# Patient Record
Sex: Female | Born: 1995 | Race: Black or African American | Hispanic: No | Marital: Married | Smoking: Never smoker
Health system: Southern US, Community
[De-identification: ages and names within clinical notes are randomized; demographics above are authoritative.]

## PROBLEM LIST (undated history)

## (undated) ENCOUNTER — Ambulatory Visit: Admission: EM | Payer: 59 | Source: Home / Self Care

## (undated) DIAGNOSIS — Z789 Other specified health status: Secondary | ICD-10-CM

## (undated) DIAGNOSIS — D571 Sickle-cell disease without crisis: Secondary | ICD-10-CM

## (undated) DIAGNOSIS — D649 Anemia, unspecified: Secondary | ICD-10-CM

## (undated) DIAGNOSIS — K219 Gastro-esophageal reflux disease without esophagitis: Secondary | ICD-10-CM

## (undated) HISTORY — DX: Anemia, unspecified: D64.9

## (undated) HISTORY — PX: HERNIA REPAIR: SHX51

---
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
## 2013-11-05 ENCOUNTER — Ambulatory Visit (INDEPENDENT_AMBULATORY_CARE_PROVIDER_SITE_OTHER): Payer: 59 | Admitting: Internal Medicine

## 2013-11-05 VITALS — BP 110/80 | HR 70 | Temp 98.8°F | Ht 62.0 in | Wt 177.6 lb

## 2013-11-05 DIAGNOSIS — R42 Dizziness and giddiness: Secondary | ICD-10-CM

## 2013-11-05 DIAGNOSIS — I890 Lymphedema, not elsewhere classified: Secondary | ICD-10-CM

## 2013-11-05 DIAGNOSIS — N946 Dysmenorrhea, unspecified: Secondary | ICD-10-CM

## 2013-11-05 NOTE — Progress Notes (Addendum)
Subjective:    Patient ID: Ellen Roberts Spires, female    DOB: 19-Apr-1996, [age] y.o.   MRN: [id number]  Dizziness Associated symptoms include fatigue. Pertinent negatives include no abdominal pain, chest pain, fever, headaches or weakness.   HPI Comments: Ellen Roberts Fjelstad is a 18 y.o. female who presents to the [facility] complaining of dizziness onset 48 hours. She states she has associated abdominal pain, neck pain, leg edema.    Menses started 2 days ago--dysmenorrhea--States she took Midol with no relief...and she went to work yesterday /had to leave work early due to dizziness. She denies headache, trouble swallowing, palpations, Appetite change, ear pain, dysuria, Vision changes or nausea.   She states she has a history of anemia.  - period is normally 4 days in length-- menstrual cycle is about a week early this month. She states she hasn't been sexually active for 1 year.    She states that she has a history of hernia repair a few years prior and has noticed the L leg swelling after. She states this leg is swollen today more than normal,and she notices this happens when working for long hours on her feet. Currently long shifts at [employer]. 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 She has had eval incl ?scan and been given compr stock which didn't work well.    For last few months, she has noticed numbness in her breasts when she wakes up in the morning. She states this is improves with movement. She denies finding any mass, discharge, rash, radiating factors in the breasts-Started when she discontinued bra at night.      Review of Systems  Constitutional: Positive for fatigue. Negative for fever, appetite change and unexpected weight change.  HENT: Negative for ear pain and trouble swallowing.   Eyes: Negative for visual disturbance.  Respiratory: Negative for chest tightness and shortness of breath.   Cardiovascular: Negative for chest pain, palpitations and leg  swelling.  Gastrointestinal: Positive for constipation. Negative for abdominal pain and blood in stool.  Genitourinary: Negative for dysuria, urgency, frequency and difficulty urinating.  Neurological: Positive for dizziness. Negative for weakness and headaches.  Psychiatric/Behavioral: Negative for confusion and sleep disturbance.    Objective:   BP 110/80  Pulse 70  Temp(Src) 98.8 F (37.1 C) (Oral)  Ht 5\' 2"  (1.575 m)  Wt 177 lb 9.6 oz (80.559 kg)  BMI 32.48 kg/m2  SpO2 100%   Physical Exam  Nursing note and vitals reviewed. Constitutional: She is oriented to person, place, and time. She appears well-developed and well-nourished. No distress.  HENT:  Head: Normocephalic and atraumatic.  Right Ear: External ear normal.  Left Ear: External ear normal.  Nose: Nose normal.  Mouth/Throat: Oropharynx is clear and moist.  Eyes: Conjunctivae and EOM are normal. Pupils are equal, round, and reactive to light.  CONJ pale.   Neck: Neck supple. No thyromegaly present.  Cardiovascular: Normal rate, regular rhythm, normal heart sounds and intact distal pulses.   No murmur heard. Pulmonary/Chest: Effort normal and breath sounds normal. No respiratory distress. Right breast exhibits no inverted nipple, no mass, no nipple discharge, no skin change and no tenderness. Left breast exhibits no inverted nipple, no mass, no nipple discharge, no skin change and no tenderness.  Abdominal: Soft. Bowel sounds are normal. She exhibits no distension and no mass. There is no tenderness. There is no rebound and no guarding.  Musculoskeletal: Normal range of motion. She exhibits no tenderness.  Left leg  is swollen with non pitting edema from the knee to foot. .  Lymphadenopathy:    She has no cervical adenopathy.  Neurological: She is alert and oriented to person, place, and time. She has normal reflexes. She displays normal reflexes. No cranial nerve deficit. Coordination normal.  Skin: Skin is warm and  dry. No rash noted.  Psychiatric: She has a normal mood and affect. Her behavior is normal.      Assessment & Plan:  Dizziness -can't reprod w/ exam  ?anemia  Lymphedema -LLE---adv compr stok again plus less on the feet jobs  Dysmenorrhea -ibupr 800 tid 2-3d each menses//she declines OCPs  OW--will need attention   Call w/ labs --note re work given  I have completed the patient encounter in its entirety as documented by the scribe, with editing by me where necessary. TRUE Garciamartinez P. Merla Richesoolittle, M.D.

## 2013-11-06 LAB — COMPREHENSIVE METABOLIC PANEL
ALK PHOS: 48 U/L (ref 39–117)
ALT: 12 U/L (ref 0–35)
AST: 17 U/L (ref 0–37)
Albumin: 4.1 g/dL (ref 3.5–5.2)
BILIRUBIN TOTAL: 0.3 mg/dL (ref 0.2–1.2)
BUN: 9 mg/dL (ref 6–23)
CO2: 25 mEq/L (ref 19–32)
Calcium: 9.6 mg/dL (ref 8.4–10.5)
Chloride: 106 mEq/L (ref 96–112)
Creat: 1.06 mg/dL (ref 0.50–1.10)
GLUCOSE: 88 mg/dL (ref 70–99)
Potassium: 4.1 mEq/L (ref 3.5–5.3)
SODIUM: 140 meq/L (ref 135–145)
TOTAL PROTEIN: 7.3 g/dL (ref 6.0–8.3)

## 2013-11-06 LAB — CBC
HCT: 36.2 % (ref 36.0–46.0)
HEMOGLOBIN: 11.8 g/dL — AB (ref 12.0–15.0)
MCH: 24.6 pg — AB (ref 26.0–34.0)
MCHC: 32.6 g/dL (ref 30.0–36.0)
MCV: 75.6 fL — ABNORMAL LOW (ref 78.0–100.0)
PLATELETS: 247 10*3/uL (ref 150–400)
RBC: 4.79 MIL/uL (ref 3.87–5.11)
RDW: 14.7 % (ref 11.5–15.5)
WBC: 4.3 10*3/uL (ref 4.0–10.5)

## 2013-11-06 LAB — TSH: TSH: 2.299 u[IU]/mL (ref 0.350–4.500)

## 2013-11-06 NOTE — Addendum Note (Signed)
Addended by: Tonye PearsonOLITTLE, Saydie Gerdts P on: 11/06/2013 03:16 PM   Modules accepted: Level of Service

## 2013-11-12 ENCOUNTER — Emergency Department (HOSPITAL_COMMUNITY)
Admission: EM | Admit: 2013-11-12 | Discharge: 2013-11-12 | Disposition: A | Discharge status: Home / Self Care | Payer: 59 | Attending: Emergency Medicine | Admitting: Emergency Medicine

## 2013-11-12 ENCOUNTER — Encounter (HOSPITAL_COMMUNITY): Payer: Self-pay | Admitting: Emergency Medicine

## 2013-11-12 DIAGNOSIS — D649 Anemia, unspecified: Secondary | ICD-10-CM | POA: Insufficient documentation

## 2013-11-12 DIAGNOSIS — R5381 Other malaise: Secondary | ICD-10-CM | POA: Diagnosis not present

## 2013-11-12 DIAGNOSIS — Z3202 Encounter for pregnancy test, result negative: Secondary | ICD-10-CM | POA: Diagnosis not present

## 2013-11-12 DIAGNOSIS — Z79899 Other long term (current) drug therapy: Secondary | ICD-10-CM | POA: Diagnosis not present

## 2013-11-12 DIAGNOSIS — R5383 Other fatigue: Secondary | ICD-10-CM

## 2013-11-12 LAB — URINALYSIS, ROUTINE W REFLEX MICROSCOPIC
BILIRUBIN URINE: NEGATIVE
Glucose, UA: NEGATIVE mg/dL
HGB URINE DIPSTICK: NEGATIVE
Ketones, ur: NEGATIVE mg/dL
Leukocytes, UA: NEGATIVE
Nitrite: NEGATIVE
Protein, ur: NEGATIVE mg/dL
SPECIFIC GRAVITY, URINE: 1.008 (ref 1.005–1.030)
Urobilinogen, UA: 0.2 mg/dL (ref 0.0–1.0)
pH: 8 (ref 5.0–8.0)

## 2013-11-12 LAB — COMPREHENSIVE METABOLIC PANEL
ALK PHOS: 60 U/L (ref 39–117)
ALT: 19 U/L (ref 0–35)
ANION GAP: 12 (ref 5–15)
AST: 21 U/L (ref 0–37)
Albumin: 3.9 g/dL (ref 3.5–5.2)
BUN: 8 mg/dL (ref 6–23)
CO2: 26 mEq/L (ref 19–32)
CREATININE: 0.82 mg/dL (ref 0.50–1.10)
Calcium: 9.4 mg/dL (ref 8.4–10.5)
Chloride: 102 mEq/L (ref 96–112)
GFR calc non Af Amer: >90 mL/min (ref >90)
GLUCOSE: 91 mg/dL (ref 70–99)
POTASSIUM: 4.2 meq/L (ref 3.7–5.3)
Sodium: 140 mEq/L (ref 137–147)
TOTAL PROTEIN: 7.6 g/dL (ref 6.0–8.3)

## 2013-11-12 LAB — CBC WITH DIFFERENTIAL/PLATELET
BASOS ABS: 0 10*3/uL (ref 0.0–0.1)
Basophils Relative: 1 % (ref 0–1)
EOS ABS: 0 10*3/uL (ref 0.0–0.7)
Eosinophils Relative: 1 % (ref 0–5)
HCT: 37.6 % (ref 36.0–46.0)
Hemoglobin: 12.3 g/dL (ref 12.0–15.0)
LYMPHS PCT: 34 % (ref 12–46)
Lymphs Abs: 1.3 10*3/uL (ref 0.7–4.0)
MCH: 25.6 pg — ABNORMAL LOW (ref 26.0–34.0)
MCHC: 32.7 g/dL (ref 30.0–36.0)
MCV: 78.2 fL (ref 78.0–100.0)
MONO ABS: 0.2 10*3/uL (ref 0.1–1.0)
Monocytes Relative: 6 % (ref 3–12)
Neutro Abs: 2.4 10*3/uL (ref 1.7–7.7)
Neutrophils Relative %: 58 % (ref 43–77)
Platelets: 249 10*3/uL (ref 150–400)
RBC: 4.81 MIL/uL (ref 3.87–5.11)
RDW: 13.8 % (ref 11.5–15.5)
WBC: 4 10*3/uL (ref 4.0–10.5)

## 2013-11-12 LAB — MONONUCLEOSIS SCREEN: Mono Screen: NEGATIVE

## 2013-11-12 LAB — PREGNANCY, URINE: Preg Test, Ur: NEGATIVE

## 2013-11-12 MED ORDER — SODIUM CHLORIDE 0.9 % IV BOLUS (SEPSIS)
1000.0000 mL | Freq: Once | INTRAVENOUS | Status: AC
  Administered 2013-11-12: 1000 mL via INTRAVENOUS

## 2013-11-12 NOTE — ED Provider Notes (Signed)
Medical screening examination/treatment/procedure(s) were performed by non-physician practitioner and as supervising physician I was immediately available for consultation/collaboration.   EKG Interpretation None      Devoria AlbeIva Benedetto Ryder, MD, Armando GangFACEP   Ward GivensIva L Addilyne Backs, MD 11/12/13 [id number]

## 2013-11-12 NOTE — Discharge Instructions (Signed)
Be sure to get between 8 and 10 hours of sleep per night. Be sure to eat a balanced diet. Followup with a primary care doctor to discuss symptoms further as needed. Refer to the resource guide below if you do not have a primary care doctor.  Fatigue Fatigue is a feeling of tiredness, lack of energy, lack of motivation, or feeling tired all the time. Having enough rest, good nutrition, and reducing stress will normally reduce fatigue. Consult your caregiver if it persists. The nature of your fatigue will help your caregiver to find out its cause. The treatment is based on the cause.  CAUSES  There are many causes for fatigue. Most of the time, fatigue can be traced to one or more of your habits or routines. Most causes fit into one or more of three general areas. They are: Lifestyle problems  Sleep disturbances.  Overwork.  Physical exertion.  Unhealthy habits.  Poor eating habits or eating disorders.  Alcohol and/or drug use .  Lack of proper nutrition (malnutrition). Psychological problems  Stress and/or anxiety problems.  Depression.  Grief.  Boredom. Medical Problems or Conditions  Anemia.  Pregnancy.  Thyroid gland problems.  Recovery from major surgery.  Continuous pain.  Emphysema or asthma that is not well controlled  Allergic conditions.  Diabetes.  Infections (such as mononucleosis).  Obesity.  Sleep disorders, such as sleep apnea.  Heart failure or other heart-related problems.  Cancer.  Kidney disease.  Liver disease.  Effects of certain medicines such as antihistamines, cough and cold remedies, prescription pain medicines, heart and blood pressure medicines, drugs used for treatment of cancer, and some antidepressants. SYMPTOMS  The symptoms of fatigue include:   Lack of energy.  Lack of drive (motivation).  Drowsiness.  Feeling of indifference to the surroundings. DIAGNOSIS  The details of how you feel help guide your caregiver in  finding out what is causing the fatigue. You will be asked about your present and past health condition. It is important to review all medicines that you take, including prescription and non-prescription items. A thorough exam will be done. You will be questioned about your feelings, habits, and normal lifestyle. Your caregiver may suggest blood tests, urine tests, or other tests to look for common medical causes of fatigue.  TREATMENT  Fatigue is treated by correcting the underlying cause. For example, if you have continuous pain or depression, treating these causes will improve how you feel. Similarly, adjusting the dose of certain medicines will help in reducing fatigue.  HOME CARE INSTRUCTIONS   Try to get the required amount of good sleep every night.  Eat a healthy and nutritious diet, and drink enough water throughout the day.  Practice ways of relaxing (including yoga or meditation).  Exercise regularly.  Make plans to change situations that cause stress. Act on those plans so that stresses decrease over time. Keep your work and personal routine reasonable.  Avoid street drugs and minimize use of alcohol.  Start taking a daily multivitamin after consulting your caregiver. SEEK MEDICAL CARE IF:   You have persistent tiredness, which cannot be accounted for.  You have fever.  You have unintentional weight loss.  You have headaches.  You have disturbed sleep throughout the night.  You are feeling sad.  You have constipation.  You have dry skin.  You have gained weight.  You are taking any new or different medicines that you suspect are causing fatigue.  You are unable to sleep at night.  You  develop any unusual swelling of your legs or other parts of your body. SEEK IMMEDIATE MEDICAL CARE IF:   You are feeling confused.  Your vision is blurred.  You feel faint or pass out.  You develop severe headache.  You develop severe abdominal, pelvic, or back  pain.  You develop chest pain, shortness of breath, or an irregular or fast heartbeat.  You are unable to pass a normal amount of urine.  You develop abnormal bleeding such as bleeding from the rectum or you vomit blood.  You have thoughts about harming yourself or committing suicide.  You are worried that you might harm someone else. MAKE SURE YOU:   Understand these instructions.  Will watch your condition.  Will get help right away if you are not doing well or get worse. Document Released: 02/13/2007 Document Revised: 07/11/2011 Document Reviewed: 02/13/2007 [facility] Patient Information 2015 [place]. This information is not intended to replace advice given to you by your health care provider. Make sure you discuss any questions you have with your health care provider.  Emergency Department Resource Guide 1) Find a Doctor and Pay Out of Pocket Although you won't have to find out who is covered by your insurance plan, it is a good idea to ask around and get recommendations. You will then need to call the office and see if the doctor you have chosen will accept you as a new patient and what types of options they offer for patients who are self-pay. Some doctors offer discounts or will set up payment plans for their patients who do not have insurance, but you will need to ask so you aren't surprised when you get to your appointment.  2) Contact Your Local Health Department Not all health departments have doctors that can see patients for sick visits, but many do, so it is worth a call to see if yours does. If you don't know where your local health department is, you can check in your phone book. The CDC also has a tool to help you locate your state's health department, and many state websites also have listings of all of their local health departments.  3) Find a Walk-in Clinic If your illness is not likely to be very severe or complicated, you may want to try a walk in clinic.  These are popping up all over the country in pharmacies, drugstores, and shopping centers. They're usually staffed by nurse practitioners or physician assistants that have been trained to treat common illnesses and complaints. They're usually fairly quick and inexpensive. However, if you have serious medical issues or chronic medical problems, these are probably not your best option.  No Primary Care Doctor: - Call Health Connect at  [phone] - they can help you locate a primary care doctor that  accepts your insurance, provides certain services, etc. - Physician Referral Service- [phone]  Chronic Pain Problems: Organization         Address  Phone   Notes  [facility]  [phone] Patients need to be referred by their primary care doctor.   Medication Assistance: Organization         Address  Phone   Notes  [facility] [address] [place], [place] [phone]) [phone] --Must be a resident of [place] [facility] [place] -- Must have NO insurance coverage whatsoever (no Medicaid/ Medicare, etc.) -- The pt. MUST have a primary care doctor that directs their care regularly and follows them  in the community   Med[facility]ist  [phone]   [employer]  [phone]    Agencies that provide inexpensive medical care: Organization         Address  Phone   Notes  [facility]  [phone]   [facility]    [phone]   [facility] [address] [place], [place] [address] [phone]   [facility] [address] [place] [place] [phone]   Planned Parenthood    [phone]   [facility]    [phone]   [facility]  [address], [place] Phone:  [phone], Fax:  [phone] Hours of Operation:  9 am - 6 pm, M-F.  Also accepts Medicaid/Medicare and self-pay.  [facility] for  Children  [address], [place] Phone: [phone], Fax: [phone]. Hours of Operation:  8:30 am - 5:30 pm, M-F.  Also accepts Medicaid and self-pay.  [facility] [place] [address], [place] Phone: [phone]   [facility] [address] Natasha Bence [place], [place] [phone], Ext. [phone] Mondays & Thursdays: 7-9 AM.  First 15 patients are seen on a first come, first serve basis.    Medicaid-accepting [facility] Providers:  Organization         Address  Phone   Notes  [facility] [address], [place] [phone] Also accepts self-pay patients.  [facility] [address] Laurell Josephs [place], [place]  [phone]   [facility] [address], [place] [phone]   [facility] [address] [place] [phone]   Renaye Rakers [address], [place]   [phone] Only accepts [place] Access [place] patients after they have their name applied to their card.   Self-Pay (no insurance) in [facility]:  Organization         Address  Phone   Notes  Sickle Cell Patients, [facility] [address] [place], [place] [phone]   [facility] [address] [place], [place] [phone]   [facility] [place]  [address], [place] [phone]   [facility]/Dr. Osei-Bonsu  [address] [place] or [address], [place] [phone] Phone number for both [place] and [place] locations is the same.  [facility] [address] [place] [phone]   [facility] [address] [place] or [address] [phone]   [facility] [address] [place] [phone], phone; [phone], fax Sees patients 1st  and 3rd Saturday of every month.  Must not qualify for public or private insurance (i.e. Medicaid, Medicare, [place] Health Choice, Veterans' Benefits)  Household income should be no more than 200% of the poverty level The clinic cannot treat you if you are pregnant or think you are pregnant  Sexually transmitted diseases are not treated at the clinic.    Dental Care: Organization         Address  Phone  Notes  [facility] Department of [facility] [address] [place], [place] [phone] Accepts children up to age 67 who are enrolled in  Medicaid or [place] Health Choice; pregnant women with a Medicaid card; and children who have applied for Medicaid or [place] Health Choice, but were declined, whose parents can pay a reduced fee at time of service.  [facility] Department of [facility]  [address], [place] [phone] Accepts children up to age 91 who are enrolled in [place] or [place] Health Choice; pregnant women with a Medicaid card; and children who have applied for Medicaid or [place] Health Choice, but were declined, whose parents can pay a reduced fee at time of service.  [facility]  [address] [place], [place] [phone] Patients are seen by appointment only. Walk-ins are not accepted. [facility] will see patients 72 years of age and older. Monday - Tuesday (8am-5pm) Most Wednesdays (8:30-5pm) $30 per visit, cash only  [facility] Adult Dental Access PROGRAM  [address], [facility] [phone] Patients are seen by appointment only. Walk-ins are not accepted. [facility] will see patients 62 years of age and older. One Wednesday Evening (Monthly: Volunteer Based).  $30 per visit, cash only  [employer]  [phone] for adults; Children under age 56, call Graduate Pediatric Dentistry at [phone]. Children aged 63-14, please call [phone] to request a pediatric  application.  Dental services are provided in all areas of dental care including fillings, crowns and bridges, complete and partial dentures, implants, gum treatment, root canals, and extractions. Preventive care is also provided. Treatment is provided to both adults and children. Patients are selected via a lottery and there is often a waiting list.   [facility] [address], [place]  [phone] [email or web address]   [facility] [address] [place], [place] [phone], Ext. [phone] Second and Fourth Thursday of each month, opens at 6:30 AM; Clinic ends at 9 AM.  Patients are seen on a first-come first-served basis, and a limited number are seen during each clinic.   [facility]  [address] Ether Griffins [place], [place] [phone]   Eligibility Requirements You must have lived in [place], [place], or [place] counties for at least the last three months.   You cannot be eligible for state or federal sponsored [place], including [employer], [place], or [employer].   You generally cannot be eligible for healthcare insurance through your employer.    How to apply: Eligibility screenings are held every Tuesday and Wednesday afternoon from 1:00 pm until 4:00 pm. You do not need an appointment for the interview!  [facility] [address] [place], [place] [phone]   [facility] Health Department  [phone]   [facility] Health Department  [phone]   [facility] Health Department  [phone]    Behavioral Health Resources in the Community: Intensive Outpatient Programs Organization         Address  Phone  Notes  [facility] [address] [place], [place] [phone]   [facility] [address], [place], [place] [phone]   [facility] [address], [place], [place]  [phone]   [facility]  [address]  [place], [place] [phone] or [phone]   Substance Abuse Resources Organization         Address  Phone  Notes  [facility]  [phone]   [facility]  [phone]   [employer]  [employer]  [phone]   Floydene Flock  [phone]   Residential & Outpatient Substance Abuse Program  [phone]   Psychological Services Organization         Address  Phone  Notes  [facility]  [phone]   [facility]  [phone]   [facility] [address] [place] [phone] or [phone]    Mobile Crisis Teams Organization         Address  Phone  Notes  [facility], Mobile Crisis Care Unit  [phone]   [facility]  [address] [place], [place] [phone]   Doristine Locks [address] [place] [phone]    Self-Help/Support Groups Organization         Address  Phone             Notes  [facility] [place] - variety of support groups  336- I7437963 Call for more information  Narcotics Anonymous (NA), [facility] [address], [employer] [place]  2 meetings at this location   Statistician         Address  Phone  Notes  [facility] [address],    [place]  [phone]   [facility]  [address], [place] [address], [place], [place] [phone]   [facility] [address] [place], [place] [phone] Admissions: 8am-3pm M-F  [facility] [address],    [place], [place] [phone]   [facility] [address] [place], [place], [place] [phone]   [facility] [address],  [place], [place] [phone]   [facility] - Intensive Outpatient [address] Laurell Josephs [address], [place], [place] [phone]   [facility] ([facility] [address] [place].,   [place], [place] [phone] or [phone]   [facility] ([facility]) [address] [place], [place] [phone] Accepts Medicaid  [facility] [place] [address],  [place] [phone] Substance Abuse/Addiction Treatment   [facility] Organization         Address  Phone  Notes  [facility]  [phone]   Angie Fava, PhD [address] Ervin Knack [place], [place]   [phone] or [phone]   [facility]   [address] [place], [place] [phone]   [facility] [address], [place], [place] [phone] Insurance/Medicaid/sponsorship through [facility] and Families [address]                                    [place], [place] [phone] Therapy/tele-psych/case  [facility] [address] [place] [address], [place] [phone]    Dr. Lolly Mustache  [phone]   [facility] [place] [facility]  [employer] [facility] Dept. 1) [address], [place] 2) [address] [place] 3)  [address], [place] [phone]  [phone]   [facility] Child Abuse Hotline 808-486-0103 or [phone] (After Hours)

## 2013-11-12 NOTE — ED Notes (Signed)
Pt. Stated, i'm fatigue, tired, not sleeping. Went to clinic and doctor said I was slight anemia so I need to get OTC iron pills, I did.

## 2013-11-12 NOTE — ED Provider Notes (Signed)
CSN: [id number]     Arrival date & time 11/12/13  1048 History   First MD Initiated Contact with Patient 11/12/13 1056     Chief Complaint  Patient presents with  . Fatigue    (Consider location/radiation/quality/duration/timing/severity/associated sxs/prior Treatment) HPI Comments: Patient is a 18 year old female history of anemia who presents to the emergency department for fatigue. Patient states that she began to feel fatigued 8 days ago while at work. Patient asked to go home after which time she rested. She states that she woke up the next day still feeling fatigued, so she went to the clinic for a checkup. Patient states that she was told that she had mild anemia which she endorses a history of. Since this time, patient has been taking over-the-counter iron pills but states that her symptoms have not improved. Patient states that pain is improved with resting and worsened with prolonged periods of standing. Patient denies associated fever, sore throat, chest pain, shortness of breath, abdominal pain, back pain, pelvic pain, vomiting or diarrhea, urinary symptoms, vaginal complaints, syncope, and near syncope. She denies sick contacts. LMP - 11/04/13  The history is provided by the patient. No language interpreter was used.    Past Medical History  Diagnosis Date  . No pertinent past medical history   . Anemia    Past Surgical History  Procedure Laterality Date  . Hernia repair     Family History  Problem Relation Age of Onset  . Diabetes Mother   . Hypertension Mother    History  Substance Use Topics  . Smoking status: Never Smoker   . Smokeless tobacco: Not on file  . Alcohol Use: No   OB History   Grav Para Term Preterm Abortions TAB SAB Ect Mult Living   0               Review of Systems  Constitutional: Positive for fatigue.  All other systems reviewed and are negative.     Allergies  Peanut-containing drug products and Aspirin  Home Medications   Prior to  Admission medications   Medication Sig Start Date End Date Taking? Authorizing Provider  ferrous sulfate 325 (65 FE) MG tablet Take 325 mg by mouth daily with breakfast.   Yes Historical Provider, MD  Fiber POWD Take by mouth 2 (two) times daily.   Yes Historical Provider, MD   BP 129/88  Pulse 80  Temp(Src) 98.2 F (36.8 C) (Oral)  Resp 14  Ht 5\' 2"  (1.575 m)  Wt 177 lb 9.6 oz (80.559 kg)  BMI 32.48 kg/m2  SpO2 100%  LMP 11/05/2013  Physical Exam  Nursing note and vitals reviewed. Constitutional: She is oriented to person, place, and time. She appears well-developed and well-nourished. No distress.  Nontoxic/nonseptic appearing  HENT:  Head: Normocephalic and atraumatic.  Mouth/Throat: Oropharynx is clear and moist. No oropharyngeal exudate.  Eyes: Conjunctivae and EOM are normal. No scleral icterus.  Neck: Normal range of motion.  Cardiovascular: Normal rate, regular rhythm and normal heart sounds.   Pulmonary/Chest: Effort normal and breath sounds normal. No respiratory distress. She has no wheezes. She has no rales.  Chest expansion symmetrical. No tachypnea or dyspnea.  Abdominal: Soft. She exhibits no distension. There is no tenderness.   Soft without tenderness or peritoneal signs  Musculoskeletal: Normal range of motion.  Neurological: She is alert and oriented to person, place, and time. She exhibits normal muscle tone. Coordination normal.  GCS 15. Patient moves extremities without ataxia.  Skin:  Skin is warm and dry. No rash noted. She is not diaphoretic. No erythema. No pallor.  Psychiatric: She has a normal mood and affect. Her behavior is normal.    ED Course  Procedures (including critical care time) Labs Review Labs Reviewed  CBC WITH DIFFERENTIAL - Abnormal; Notable for the following:    MCH 25.6 (*)    All other components within normal limits  COMPREHENSIVE METABOLIC PANEL - Abnormal; Notable for the following:    Total Bilirubin <0.2 (*)    All other  components within normal limits  MONONUCLEOSIS SCREEN  PREGNANCY, URINE  URINALYSIS, ROUTINE W REFLEX MICROSCOPIC    Imaging Review No results found.   EKG Interpretation None      MDM   Final diagnoses:  Other fatigue    18 year old female presents for feelings of fatigue x8 days. Patient well and nontoxic appearing, hemodynamically stable, and afebrile. No significant past medical history which could contribute to her symptoms today. Physical exam unremarkable. Labs negative for leukocytosis, anemia, or electrolyte imbalance. Liver and kidney function preserved. Urine pregnancy negative and urinalysis does not suggest infection. Mononucleosis screen also completed which is negative today. Doubt emergent cause of patient's fatigue given her reassuring exam and workup.  Patient stable for discharge with instruction to followup with a primary doctor for further evaluation of her symptoms as needed. Have advised adequate sleep and a balanced diet. Resource guide provided. Return precautions discussed iand patient agreeable to plan with no unaddressed concerns.   Filed Vitals:   11/12/13 1216 11/12/13 1245 11/12/13 1300 11/12/13 1302  BP: 100/35 119/73 117/75 129/88  Pulse: 59 71 64 80  Temp:      TempSrc:      Resp: 18 21 18 14   Height:      Weight:      SpO2: 100% 100% 100% 100%      Antony Madura, PA-C 11/12/13 1329

## 2013-11-13 ENCOUNTER — Telehealth: Payer: Self-pay

## 2013-11-21 ENCOUNTER — Telehealth: Payer: Self-pay

## 2013-11-21 NOTE — Telephone Encounter (Signed)
Pt dropped off FMLA ppw 7/22, and placed in Dr. Netta Corriganoolittle's for completion within 5 to 7 business days

## 2013-11-25 NOTE — Telephone Encounter (Signed)
Would you see if you can find this

## 2013-11-26 NOTE — Telephone Encounter (Signed)
Copy of ppw in your box awaiting completion.

## 2013-11-27 ENCOUNTER — Telehealth: Payer: Self-pay

## 2013-11-27 NOTE — Telephone Encounter (Signed)
Pt notified that note is reprinted and ready for pickup

## 2013-11-27 NOTE — Telephone Encounter (Signed)
PT STATES SHE WAS GIVEN AN OOW NOTE AND HAVE MISPLACED IT, WOULD LIKE TO KNOW IF SHE COULD GET A COPY PLEASE CALL [phone]

## 2014-10-17 ENCOUNTER — Emergency Department (INDEPENDENT_AMBULATORY_CARE_PROVIDER_SITE_OTHER): Payer: BLUE CROSS/BLUE SHIELD

## 2014-10-17 ENCOUNTER — Emergency Department (HOSPITAL_COMMUNITY)
Admission: EM | Admit: 2014-10-17 | Discharge: 2014-10-17 | Disposition: A | Discharge status: Home / Self Care | Payer: BLUE CROSS/BLUE SHIELD | Source: Home / Self Care | Attending: Family Medicine | Admitting: Family Medicine

## 2014-10-17 ENCOUNTER — Encounter (HOSPITAL_COMMUNITY): Payer: Self-pay | Admitting: Emergency Medicine

## 2014-10-17 DIAGNOSIS — M79672 Pain in left foot: Secondary | ICD-10-CM

## 2014-10-17 NOTE — ED Notes (Signed)
Discussion among direct care staff resulted in the patient used term "couple" very vague.  Where "couple" means "2", patient did not use the term in this manner.  Patient complaints actually occurred over a 6-7 month period of time

## 2014-10-17 NOTE — ED Provider Notes (Signed)
CSN: [id number]     Arrival date & time 10/17/14  1447 History   First MD Initiated Contact with Patient 10/17/14 1721     Chief Complaint  Patient presents with  . Leg Swelling   (Consider location/radiation/quality/duration/timing/severity/associated sxs/prior Treatment) HPI Comments: 19 year old female complaining of pain and swelling to the left foot for 6-7 months. No known injury. She states that she wears flat-type shoes and rarely wears high heels. Pain is worse with weightbearing and ambulation.   Past Medical History  Diagnosis Date  . No pertinent past medical history   . Anemia    Past Surgical History  Procedure Laterality Date  . Hernia repair     Family History  Problem Relation Age of Onset  . Diabetes Mother   . Hypertension Mother    History  Substance Use Topics  . Smoking status: Never Smoker   . Smokeless tobacco: Not on file  . Alcohol Use: No   OB History    Gravida Para Term Preterm AB TAB SAB Ectopic Multiple Living   0              Review of Systems  Constitutional: Negative.   Musculoskeletal: Negative for myalgias and back pain.       Left foot pain as above.  Skin: Negative.   Neurological: Negative.   Psychiatric/Behavioral: Negative.     Allergies  Peanut-containing drug products and Aspirin  Home Medications   Prior to Admission medications   Medication Sig Start Date End Date Taking? Authorizing Provider  ferrous sulfate 325 (65 FE) MG tablet Take 325 mg by mouth daily with breakfast.    Historical Provider, MD  Fiber POWD Take by mouth 2 (two) times daily.    Historical Provider, MD   LMP 09/16/2014 Physical Exam  Constitutional: She appears well-developed and well-nourished. No distress.  Neck: Normal range of motion. Neck supple.  Cardiovascular: Normal rate.   Pulmonary/Chest: Effort normal. No respiratory distress.  Musculoskeletal: Normal range of motion.  Mild swelling to the left foot and lesser to the ankle. Minor  tenderness to the left metacarpal. No deformity. No discoloration. Distal neurovascular motor sensory is intact. Pedal pulses 2+. No calf pain or tenderness.   Neurological: She is alert. She exhibits normal muscle tone.  Skin: Skin is warm.  Psychiatric: She has a normal mood and affect.  Nursing note and vitals reviewed.   ED Course  Procedures (including critical care time) Labs Review Labs Reviewed - No data to display  Imaging Review Dg Foot Complete Left  10/17/2014   CLINICAL DATA:  Left foot swelling for several months without known injury. Initial encounter.  EXAM: LEFT FOOT - COMPLETE 3+ VIEW  COMPARISON:  None.  FINDINGS: There is no evidence of fracture or dislocation. There is no evidence of arthropathy or other focal bone abnormality. Soft tissues are unremarkable.  IMPRESSION: Normal left foot.   Electronically Signed   By: Lupita Raider, M.D.   On: 10/17/2014 17:55     MDM   1. Left foot pain    Uncertain etiology for left foot pain and swelling. Possibly repetitive use. She is [facility] evaluation. In the meantime well applied Coban wrap to the foot for compression. Ice and elevation may also help.    Hayden Rasmussen, NP 10/17/14 1810

## 2014-10-17 NOTE — Discharge Instructions (Signed)

## 2014-10-17 NOTE — ED Notes (Signed)
Patient has left lower extremity swelling including lower leg, ankle, and foot.  Reports symptoms have been occuring for 2 months.

## 2015-07-30 ENCOUNTER — Ambulatory Visit (INDEPENDENT_AMBULATORY_CARE_PROVIDER_SITE_OTHER): Payer: Self-pay | Admitting: Physician Assistant

## 2015-07-30 ENCOUNTER — Telehealth: Payer: Self-pay

## 2015-07-30 VITALS — BP 106/60 | HR 79 | Temp 98.4°F | Resp 16 | Ht 63.0 in | Wt 187.0 lb

## 2015-07-30 DIAGNOSIS — R600 Localized edema: Secondary | ICD-10-CM

## 2015-07-30 LAB — COMPLETE METABOLIC PANEL WITH GFR
ALT: 13 U/L (ref 6–29)
AST: 17 U/L (ref 10–30)
Albumin: 3.9 g/dL (ref 3.6–5.1)
Alkaline Phosphatase: 51 U/L (ref 33–115)
BILIRUBIN TOTAL: 0.3 mg/dL (ref 0.2–1.2)
BUN: 8 mg/dL (ref 7–25)
CHLORIDE: 104 mmol/L (ref 98–110)
CO2: 29 mmol/L (ref 20–31)
CREATININE: 0.89 mg/dL (ref 0.50–1.10)
Calcium: 9.4 mg/dL (ref 8.6–10.2)
GFR, Est African American: >89 mL/min (ref >=60)
GFR, Est Non African American: >89 mL/min (ref >=60)
GLUCOSE: 77 mg/dL (ref 65–99)
Potassium: 3.7 mmol/L (ref 3.5–5.3)
SODIUM: 140 mmol/L (ref 135–146)
TOTAL PROTEIN: 7 g/dL (ref 6.1–8.1)

## 2015-07-30 LAB — POCT CBC
Granulocyte percent: 55.9 %G (ref 37–80)
HCT, POC: 33.3 % — AB (ref 37.7–47.9)
HEMOGLOBIN: 11.8 g/dL — AB (ref 12.2–16.2)
LYMPH, POC: 1.5 (ref 0.6–3.4)
MCH: 27.5 pg (ref 27–31.2)
MCHC: 35.5 g/dL — AB (ref 31.8–35.4)
MCV: 77.4 fL — AB (ref 80–97)
MID (cbc): 0.3 (ref 0–0.9)
MPV: 8.5 fL (ref 0–99.8)
PLATELET COUNT, POC: 233 10*3/uL (ref 142–424)
POC Granulocyte: 2.3 (ref 2–6.9)
POC LYMPH PERCENT: 36.7 %L (ref 10–50)
POC MID %: 7.4 %M (ref 0–12)
RBC: 4.3 M/uL (ref 4.04–5.48)
RDW, POC: 13.5 %
WBC: 4.2 10*3/uL — AB (ref 4.6–10.2)

## 2015-07-30 LAB — POCT URINALYSIS DIP (MANUAL ENTRY)
BILIRUBIN UA: NEGATIVE
GLUCOSE UA: NEGATIVE
Ketones, POC UA: NEGATIVE
Leukocytes, UA: NEGATIVE
Nitrite, UA: NEGATIVE
Protein Ur, POC: NEGATIVE
SPEC GRAV UA: 1.015
Urobilinogen, UA: 0.2
pH, UA: 7

## 2015-07-30 LAB — POCT GLYCOSYLATED HEMOGLOBIN (HGB A1C): Hemoglobin A1C: 5.5

## 2015-07-30 MED ORDER — FUROSEMIDE 20 MG PO TABS: 10.0000 mg | ORAL_TABLET | Freq: Every day | ORAL | Status: DC

## 2015-07-30 NOTE — Patient Instructions (Signed)
     IF you received an x-ray today, you will receive an invoice from [facility]. Please contact [facility] at [phone] with questions or concerns regarding your invoice.   IF you received labwork today, you will receive an invoice from [facility]. Please contact [facility] at [phone] with questions or concerns regarding your invoice.   Our billing staff will not be able to assist you with questions regarding bills from these companies.  You will be contacted with the lab results as soon as they are available. The fastest way to get your results is to activate your My Chart account. Instructions are located on the last page of this paperwork. If you have not heard from us regarding the results in 2 weeks, please contact this office.      

## 2015-07-30 NOTE — Progress Notes (Signed)
07/30/2015 4:13 PM   DOB: 1995/06/17 / MRN: [id number]  SUBJECTIVE:  Ellen Roberts is a 20 y.o. female presenting for the complaint of left foot swelling. This has been going on for roughly 1 year and she reports other work ups have been of little benefit. She reports the right foot swells also, however the right is less than left.  She does associate some mild pain about the proximal metatarsals, but denies a history of trauma to the foot.  Her mother has multiple varicosities and also has mild leg swelling.  She denies SOB, chest pain and cough. She does not take any prescription medication.   Radiographs of the foot in 2016 were normal.  She has never had an EKG.  Chest rads in 2012 were normal.   She is allergic to peanut-containing drug products and aspirin.   She  has a past medical history of No pertinent past medical history and Anemia.    She  reports that she has never smoked. She does not have any smokeless tobacco history on file. She reports that she does not drink alcohol or use illicit drugs. She  reports that she does not engage in sexual activity. The patient  has past surgical history that includes Hernia repair.  Her family history includes Diabetes in her mother; Hypertension in her mother.  Review of Systems  Constitutional: Negative for fever.  Cardiovascular: Positive for leg swelling.  Gastrointestinal: Negative for nausea.  Genitourinary: Negative for dysuria.  Musculoskeletal: Positive for joint pain.  Skin: Negative for rash.  Neurological: Negative for dizziness and headaches.    Problem list and medications reviewed and updated by myself where necessary, and exist elsewhere in the encounter.   OBJECTIVE:  BP 106/60 mmHg  Pulse 79  Temp(Src) 98.4 F (36.9 C)  Resp 16  Ht 5\' 3"  (1.6 m)  Wt 187 lb (84.823 kg)  BMI 33.13 kg/m2  SpO2 95%  LMP 07/30/2015 (Approximate)  Physical Exam  Constitutional: She is oriented to person, place, and time.    Cardiovascular: Normal rate, regular rhythm and normal heart sounds.   Pulses:      Dorsalis pedis pulses are 2+ on the right side, and 2+ on the left side.       Posterior tibial pulses are 2+ on the right side, and 2+ on the left side.  Pulmonary/Chest: Effort normal and breath sounds normal.  Neurological: She is alert and oriented to person, place, and time.  Vitals reviewed.   Results for orders placed or performed in visit on 07/30/15 (from the past 72 hour(s))  POCT glycosylated hemoglobin (Hb A1C)     Status: None   Collection Time: 07/30/15  3:03 PM  Result Value Ref Range   Hemoglobin A1C 5.5   POCT CBC     Status: Abnormal   Collection Time: 07/30/15  3:03 PM  Result Value Ref Range   WBC 4.2 (A) 4.6 - 10.2 K/uL   Lymph, poc 1.5 0.6 - 3.4   POC LYMPH PERCENT 36.7 10 - 50 %L   MID (cbc) 0.3 0 - 0.9   POC MID % 7.4 0 - 12 %M   POC Granulocyte 2.3 2 - 6.9   Granulocyte percent 55.9 37 - 80 %G   RBC 4.30 4.04 - 5.48 M/uL   Hemoglobin 11.8 (A) 12.2 - 16.2 g/dL   HCT, POC 11.933.3 (A) 14.737.7 - 47.9 %   MCV 77.4 (A) 80 - 97 fL   MCH, POC 27.5  27 - 31.2 pg   MCHC 35.5 (A) 31.8 - 35.4 g/dL   RDW, POC 16.1 %   Platelet Count, POC 233 142 - 424 K/uL   MPV 8.5 0 - 99.8 fL  POCT urinalysis dipstick     Status: Abnormal   Collection Time: 07/30/15  3:03 PM  Result Value Ref Range   Color, UA yellow yellow   Clarity, UA clear clear   Glucose, UA negative negative   Bilirubin, UA negative negative   Ketones, POC UA negative negative   Spec Grav, UA 1.015    Blood, UA small (A) negative   pH, UA 7.0    Protein Ur, POC negative negative   Urobilinogen, UA 0.2    Nitrite, UA Negative Negative   Leukocytes, UA Negative Negative   Lab Results  Component Value Date   CREATININE 0.82 11/12/2013     No results found.  ASSESSMENT AND PLAN  Sullivan was seen today for foot swelling.  Diagnoses and all orders for this visit:  Pedal edema: This is most likely a vascular  problem as her workup thus far is negative. I would refer her today but she does not want this now because she has no insurance.  Will provide a small dose of lasix to help with her symptoms.  She has not been sexually active for two years.   -     POCT glycosylated hemoglobin (Hb A1C) -     POCT CBC -     COMPLETE METABOLIC PANEL WITH GFR -     POCT urinalysis dipstick -     Microalbumin, urine -     EKG 12-Lead -     furosemide (LASIX) 20 MG tablet; Take 0.5 tablets (10 mg total) by mouth daily. Take one daily as needed for swelling.  Do not take this if you are feeling dizzy.    The patient was advised to call or return to clinic if she does not see an improvement in symptoms or to seek the care of the closest emergency department if she worsens with the above plan.   Deliah Boston, MHS, PA-C [facility] 07/30/2015 4:13 PM

## 2015-07-30 NOTE — Telephone Encounter (Signed)
The patient called to request a letter for work that excuses her from standing for long periods of time.  She needs the note to indicate how long she can stand per shift.  Her job currently has her placed in a role that requires standing.  The patient, seen today, would like to pick up the note tomorrow morning.   CB#: [phone]

## 2015-07-31 ENCOUNTER — Encounter: Payer: Self-pay | Admitting: Urgent Care

## 2015-07-31 LAB — MICROALBUMIN, URINE: Microalb, Ur: 0.6 mg/dL

## 2015-07-31 NOTE — Telephone Encounter (Signed)
Pt called to check the status of letter. Recommended that she RTC to discuss this with a clinical staff member.

## 2015-07-31 NOTE — Telephone Encounter (Signed)
Patient came in office to speak with a clinical staff member. Patient notified message was sent to Deliah BostonMichael Clark, awaiting response.

## 2015-08-05 NOTE — Telephone Encounter (Signed)
This is really hard to judge without talking with her.  I started her on Lasix as needed for swelling and she has compression stockings.  It probably best she come back so we can see how things are going and I can compose a note that would best serve her at that time. Ellen BostonMichael Lyndsie Wallman, MS, PA-C 8:15 AM, 08/05/2015

## 2015-09-23 ENCOUNTER — Ambulatory Visit (INDEPENDENT_AMBULATORY_CARE_PROVIDER_SITE_OTHER): Payer: Self-pay | Admitting: Family Medicine

## 2015-09-23 VITALS — BP 126/68 | HR 74 | Temp 97.9°F | Resp 16 | Ht 63.0 in | Wt 183.8 lb

## 2015-09-23 DIAGNOSIS — R531 Weakness: Secondary | ICD-10-CM

## 2015-09-23 DIAGNOSIS — R5383 Other fatigue: Secondary | ICD-10-CM

## 2015-09-23 LAB — POCT CBC
Granulocyte percent: 66 %G (ref 37–80)
HCT, POC: 36.4 % — AB (ref 37.7–47.9)
Hemoglobin: 12.2 g/dL (ref 12.2–16.2)
LYMPH, POC: 1.2 (ref 0.6–3.4)
MCH: 25.7 pg — AB (ref 27–31.2)
MCHC: 33.4 g/dL (ref 31.8–35.4)
MCV: 76.9 fL — AB (ref 80–97)
MID (CBC): 0.3 (ref 0–0.9)
MPV: 8.3 fL (ref 0–99.8)
POC Granulocyte: 3 (ref 2–6.9)
POC LYMPH PERCENT: 26.6 %L (ref 10–50)
POC MID %: 7.4 % (ref 0–12)
Platelet Count, POC: 175 10*3/uL (ref 142–424)
RBC: 4.74 M/uL (ref 4.04–5.48)
RDW, POC: 13.8 %
WBC: 4.5 10*3/uL — AB (ref 4.6–10.2)

## 2015-09-23 LAB — COMPREHENSIVE METABOLIC PANEL
ALBUMIN: 3.9 g/dL (ref 3.6–5.1)
ALK PHOS: 48 U/L (ref 33–115)
ALT: 11 U/L (ref 6–29)
AST: 16 U/L (ref 10–30)
BILIRUBIN TOTAL: 0.3 mg/dL (ref 0.2–1.2)
BUN: 7 mg/dL (ref 7–25)
CALCIUM: 9.2 mg/dL (ref 8.6–10.2)
CO2: 23 mmol/L (ref 20–31)
Chloride: 105 mmol/L (ref 98–110)
Creat: 0.92 mg/dL (ref 0.50–1.10)
Glucose, Bld: 60 mg/dL — ABNORMAL LOW (ref 65–99)
Potassium: 3.6 mmol/L (ref 3.5–5.3)
Sodium: 138 mmol/L (ref 135–146)
Total Protein: 6.9 g/dL (ref 6.1–8.1)

## 2015-09-23 LAB — POCT URINALYSIS DIP (MANUAL ENTRY)
BILIRUBIN UA: NEGATIVE
Glucose, UA: NEGATIVE
Ketones, POC UA: NEGATIVE
Leukocytes, UA: NEGATIVE
Nitrite, UA: NEGATIVE
PH UA: 6
Protein Ur, POC: NEGATIVE
RBC UA: NEGATIVE
Spec Grav, UA: 1.01
Urobilinogen, UA: 0.2

## 2015-09-23 LAB — GLUCOSE, POCT (MANUAL RESULT ENTRY): POC Glucose: 67 mg/dl — AB (ref 70–99)

## 2015-09-23 LAB — POCT SEDIMENTATION RATE: POCT SED RATE: 13 mm/hr (ref 0–22)

## 2015-09-23 LAB — POCT GLYCOSYLATED HEMOGLOBIN (HGB A1C): Hemoglobin A1C: 5.3

## 2015-09-23 NOTE — Patient Instructions (Addendum)
I suspect you are developing an acute flu like illness - likely a virus that will run its course in a week or so - but overall your labs look good.  Try to force yourself to eat some crackers or soup. Try to drink gatorade or pedialyte - something with some sugar in it.  Try to give your body some protein so it has some fuel to fight off any infections (nuts, meat, dairy, beans).  Your iron levels are borderline low so continue taking the iron supplement once a day and increase your iron in diet.  If you develop other symptoms such as a rash, severe headache, headache made worse by light, inability to bend your neck without severe pain, make sure you come back immediately.   IF you received an x-ray today, you will receive an invoice from [facility]. Please contact [facility] at [phone] with questions or concerns regarding your invoice.   IF you received labwork today, you will receive an invoice from [employer]. Please contact [facility] at [phone] with questions or concerns regarding your invoice.   Our billing staff will not be able to assist you with questions regarding bills from these companies.  You will be contacted with the lab results as soon as they are available. The fastest way to get your results is to activate your My Chart account. Instructions are located on the last page of this paperwork. If you have not heard from [place] regarding the results in 2 weeks, please contact this office.     Fatigue Fatigue is feeling tired all of the time, a lack of energy, or a lack of motivation. Occasional or mild fatigue is often a normal response to activity or life in general. However, long-lasting (chronic) or extreme fatigue may indicate an underlying medical condition. HOME CARE INSTRUCTIONS  Watch your fatigue for any changes. The following actions may help to lessen any discomfort you are feeling:  Talk to your health care provider about  how much sleep you need each night. Try to get the required amount every night.  Take medicines only as directed by your health care provider.  Eat a healthy and nutritious diet. Ask your health care provider if you need help changing your diet.  Drink enough fluid to keep your urine clear or pale yellow.  Practice ways of relaxing, such as yoga, meditation, massage therapy, or acupuncture.  Exercise regularly.   Change situations that cause you stress. Try to keep your work and personal routine reasonable.  Do not abuse illegal drugs.  Limit alcohol intake to no more than 1 drink per day for nonpregnant women and 2 drinks per day for men. One drink equals 12 ounces of beer, 5 ounces of wine, or 1 ounces of hard liquor.  Take a multivitamin, if directed by your health care provider. SEEK MEDICAL CARE IF:   Your fatigue does not get better.  You have a fever.   You have unintentional weight loss or gain.  You have headaches.   You have difficulty:   Falling asleep.  Sleeping throughout the night.  You feel angry, guilty, anxious, or sad.   You are unable to have a bowel movement (constipation).   You skin is dry.   Your legs or another part of your body is swollen.  SEEK IMMEDIATE MEDICAL CARE IF:   You feel confused.   Your vision is blurry.  You feel faint or pass out.   You have a severe  headache.   You have severe abdominal, pelvic, or back pain.   You have chest pain, shortness of breath, or an irregular or fast heartbeat.   You are unable to urinate or you urinate less than normal.   You develop abnormal bleeding, such as bleeding from the rectum, vagina, nose, lungs, or nipples.  You vomit blood.   You have thoughts about harming yourself or committing suicide.   You are worried that you might harm someone else.    This information is not intended to replace advice given to you by your health care provider. Make sure you  discuss any questions you have with your health care provider.   Document Released: 02/13/2007 Document Revised: 05/09/2014 Document Reviewed: 08/20/2013 Elsevier Interactive Patient Education 2016 [employer]. Iron-Rich Diet Iron is a mineral that helps your body to produce hemoglobin. Hemoglobin is a protein in your red blood cells that carries oxygen to your body's tissues. Eating too little iron may cause you to feel weak and tired, and it can increase your risk for infection. Eating enough iron is necessary for your body's metabolism, muscle function, and nervous system. Iron is naturally found in many foods. It can also be added to foods or fortified in foods. There are two types of dietary iron:  Heme iron. Heme iron is absorbed by the body more easily than nonheme iron. Heme iron is found in meat, poultry, and fish.  Nonheme iron. Nonheme iron is found in dietary supplements, iron-fortified grains, beans, and vegetables. You may need to follow an iron-rich diet if:  You have been diagnosed with iron deficiency or iron-deficiency anemia.  You have a condition that prevents you from absorbing dietary iron, such as:  Infection in your intestines.  Celiac disease. This involves long-lasting (chronic) inflammation of your intestines.  You do not eat enough iron.  You eat a diet that is high in foods that impair iron absorption.  You have lost a lot of blood.  You have heavy bleeding during your menstrual cycle.  You are pregnant. WHAT IS MY PLAN? Your health care provider may help you to determine how much iron you need per day based on your condition. Generally, when a person consumes sufficient amounts of iron in the diet, the following iron needs are met:  Men.  59-65 years old: 11 mg per day.  47-47 years old: 8 mg per day.  Women.   75-51 years old: 15 mg per day.  6-76 years old: 18 mg per day.  Over 81 years old: 8 mg per day.  Pregnant women: 27 mg per  day.  Breastfeeding women: 9 mg per day. WHAT DO I NEED TO KNOW ABOUT AN IRON-RICH DIET?  Eat fresh fruits and vegetables that are high in vitamin C along with foods that are high in iron. This will help increase the amount of iron that your body absorbs from food, especially with foods containing nonheme iron. Foods that are high in vitamin C include oranges, peppers, tomatoes, and mango.  Take iron supplements only as directed by your health care provider. Overdose of iron can be life-threatening. If you were prescribed iron supplements, take them with orange juice or a vitamin C supplement.  Cook foods in pots and pans that are made from iron.   Eat nonheme iron-containing foods alongside foods that are high in heme iron. This helps to improve your iron absorption.   Certain foods and drinks contain compounds that impair iron absorption. Avoid eating these  foods in the same meal as iron-rich foods or with iron supplements. These include:  Coffee, black tea, and red wine.  Milk, dairy products, and foods that are high in calcium.  Beans, soybeans, and peas.  Whole grains.  When eating foods that contain both nonheme iron and compounds that impair iron absorption, follow these tips to absorb iron better.   Soak beans overnight before cooking.  Soak whole grains overnight and drain them before using.  Ferment flours before baking, such as using yeast in bread dough. WHAT FOODS CAN I EAT? Grains Iron-fortified breakfast cereal. Iron-fortified whole-wheat bread. Enriched rice. Sprouted grains. Vegetables Spinach. Potatoes with skin. Green peas. Broccoli. Red and green bell peppers. Fermented vegetables. Fruits Prunes. Raisins. Oranges. Strawberries. Mango. Grapefruit. Meats and Other Protein Sources Beef liver. Oysters. Beef. Shrimp. [place]. Chicken. [place]. Sardines. Chickpeas. Nuts. Tofu. Beverages Tomato juice. Fresh orange juice. Prune juice. Hibiscus tea. Fortified  instant breakfast shakes. Condiments Tahini. Fermented soy sauce. Sweets and Desserts Black-strap molasses.  Other Wheat germ. The items listed above may not be a complete list of recommended foods or beverages. Contact your dietitian for more options. WHAT FOODS ARE NOT RECOMMENDED? Grains Whole grains. Bran cereal. Bran flour. Oats. Vegetables Artichokes. Brussels sprouts. Kale. Fruits Blueberries. Raspberries. Strawberries. Figs. Meats and Other Protein Sources Soybeans. Products made from soy protein. Dairy Milk. Cream. Cheese. Yogurt. Cottage cheese. Beverages Coffee. Black tea. Red wine. Sweets and Desserts Cocoa. Chocolate. Ice cream. Other Basil. Oregano. Parsley. The items listed above may not be a complete list of foods and beverages to avoid. Contact your dietitian for more information.   This information is not intended to replace advice given to you by your health care provider. Make sure you discuss any questions you have with your health care provider.   Document Released: 11/30/2004 Document Revised: 05/09/2014 Document Reviewed: 11/13/2013 Elsevier Interactive Patient Education [employer].

## 2015-09-23 NOTE — Progress Notes (Signed)
Subjective:  By signing my name below, I, Ellen Roberts, attest that this documentation has been prepared under the direction and in the presence of Ellen SorensonEva Hien Perreira, MD.  Electronically Signed: Andrew Auaven Roberts, ED Scribe. 09/23/2015. 10:24 AM.   Patient ID: Ellen Roberts, female    DOB: [birth year], 20 y.o.   MRN: [id number]  HPI Chief Complaint  Patient presents with  . Fatigue    x yesterday  . Extremity Weakness    all over    HPI Comments: Ellen Roberts is a 20 y.o. female who presents to the [facility] complaining of fatigue that began yesterday. Pt reports associated chills and decreased appetite. She last ate yesterday at 2pm. Pt took iron supplements yesterday but does not take them regularly. Pt denies other symptoms including fever, cough, sore throat, rash, body aches, nausea, emesis, urinary and bowel changes, abnormal menses. She denies feeling depressed.     There are no active problems to display for this patient.  Past Medical History  Diagnosis Date  . No pertinent past medical history   . Anemia    Past Surgical History  Procedure Laterality Date  . Hernia repair     Allergies  Allergen Reactions  . Peanut-Containing Drug Products     Ance   . Aspirin Itching and Rash    Stomach pains   Prior to Admission medications   Medication Sig Start Date End Date Taking? Authorizing Provider  ferrous sulfate 325 (65 FE) MG tablet Take 325 mg by mouth daily with breakfast.   Yes Historical Provider, MD  Fiber POWD Take by mouth 2 (two) times daily. Reported on 07/30/2015   Yes Historical Provider, MD  furosemide (LASIX) 20 MG tablet Take 0.5 tablets (10 mg total) by mouth daily. Take one daily as needed for swelling.  Do not take this if you are feeling dizzy. Patient not taking: Reported on 09/23/2015 07/30/15   Ofilia NeasMichael L Clark, PA-C   Social History   Social History  . Marital Status: Single    Spouse Name: N/A  . Number of Children: N/A  . Years of  Education: N/A   Occupational History  . Not on file.   Social History Main Topics  . Smoking status: Never Smoker   . Smokeless tobacco: Not on file  . Alcohol Use: No  . Drug Use: No  . Sexual Activity: No   Other Topics Concern  . Not on file   Social History Narrative   Review of Systems  Constitutional: Positive for chills, appetite change and fatigue. Negative for fever.  HENT: Negative for congestion and sore throat.   Respiratory: Negative for cough.   Gastrointestinal: Negative for nausea, vomiting, abdominal pain, diarrhea, constipation, blood in stool and abdominal distention.  Genitourinary: Negative for dysuria, urgency, frequency, hematuria, decreased urine volume, difficulty urinating and menstrual problem.  Musculoskeletal: Negative for myalgias.  Skin: Negative for rash.  Neurological: Negative for dizziness, weakness and light-headedness.  Psychiatric/Behavioral: Negative for dysphoric mood.    Objective:   Physical Exam  Constitutional: She is oriented to person, place, and time. She appears well-developed and well-nourished. No distress.  HENT:  Head: Normocephalic and atraumatic.  Right Ear: Hearing and external ear normal.  Left Ear: Hearing and external ear normal.  Mouth/Throat: Posterior oropharyngeal erythema present. No posterior oropharyngeal edema.  Right TM impacted with black cerumen.  Nares with erythema.  tonsolith bilaterally.  Eyes: Conjunctivae and EOM are normal.  Neck: Neck supple. Thyromegaly (  mildly enlarged goiter) present.  Cardiovascular: Normal rate, regular rhythm, S1 normal, S2 normal and normal heart sounds.   No murmur heard. Pulses:      Dorsalis pedis pulses are 2+ on the right side, and 2+ on the left side.  Pulmonary/Chest: Effort normal and breath sounds normal. She has no wheezes. She has no rales.  Abdominal: Soft. Bowel sounds are normal. She exhibits no distension. There is no tenderness. There is negative  Murphy's sign.  Musculoskeletal: Normal range of motion. She exhibits no edema.  No LE edema  Lymphadenopathy:    She has no cervical adenopathy.       Right: No supraclavicular adenopathy present.       Left: No supraclavicular adenopathy present.  Neurological: She is alert and oriented to person, place, and time.  2+ DTR bilaterally  Skin: Skin is warm and dry.  Psychiatric: She has a normal mood and affect. Her behavior is normal.  Nursing note and vitals reviewed.  Filed Vitals:   09/23/15 0955  BP: 126/68  Pulse: 74  Temp: 97.9 F (36.6 C)  TempSrc: Oral  Resp: 16  Height:  (1.6 m)  Weight: 183 lb 12.8 oz (83.371 kg)  SpO2: 99%   Results for orders placed or performed in visit on 09/23/15  POCT CBC  Result Value Ref Range   WBC 4.5 (A) 4.6 - 10.2 K/uL   Lymph, poc 1.2 0.6 - 3.4   POC LYMPH PERCENT 26.6 10 - 50 %L   MID (cbc) 0.3 0 - 0.9   POC MID % 7.4 0 - 12 %M   POC Granulocyte 3.0 2 - 6.9   Granulocyte percent 66.0 37 - 80 %G   RBC 4.74 4.04 - 5.48 M/uL   Hemoglobin 12.2 12.2 - 16.2 g/dL   HCT, POC 11.9 (A) 14.7 - 47.9 %   MCV 76.9 (A) 80 - 97 fL   MCH, POC 25.7 (A) 27 - 31.2 pg   MCHC 33.4 31.8 - 35.4 g/dL   RDW, POC 82.9 %   Platelet Count, POC 175 142 - 424 K/uL   MPV 8.3 0 - 99.8 fL  POCT urinalysis dipstick  Result Value Ref Range   Color, UA yellow yellow   Clarity, UA clear clear   Glucose, UA negative negative   Bilirubin, UA negative negative   Ketones, POC UA negative negative   Spec Grav, UA 1.010    Blood, UA negative negative   pH, UA 6.0    Protein Ur, POC negative negative   Urobilinogen, UA 0.2    Nitrite, UA Negative Negative   Leukocytes, UA Negative Negative  POCT glycosylated hemoglobin (Hb A1C)  Result Value Ref Range   Hemoglobin A1C 5.3   POCT glucose (manual entry)  Result Value Ref Range   POC Glucose 67 (A) 70 - 99 mg/dl    Assessment & Plan:   1. Other fatigue   2. Weakness   Suspect onset of acute viral  syndrome - rest, symptomatic trx, recheck if not improved in a wk.  Orders Placed This Encounter  Procedures  . Comprehensive metabolic panel  . TSH  . Care order/instruction    Work Note needed x 3 (2 classes and a job), she can go back on Monday 5/29; pt already has AVS and she is a STOP  . POCT CBC  . POCT SEDIMENTATION RATE  . POCT urinalysis dipstick  . POCT glycosylated hemoglobin (Hb A1C)  . POCT glucose (manual  entry)     I personally performed the services described in this documentation, which was scribed in my presence. The recorded information has been reviewed and considered, and addended by me as needed.  Ellen Sorenson, MD MPH

## 2015-09-24 LAB — TSH: TSH: 1.13 mIU/L

## 2015-10-14 ENCOUNTER — Encounter: Payer: Self-pay | Admitting: Family Medicine

## 2018-01-16 ENCOUNTER — Emergency Department (HOSPITAL_COMMUNITY)
Admission: EM | Admit: 2018-01-16 | Discharge: 2018-01-16 | Disposition: A | Discharge status: Home / Self Care | Payer: BLUE CROSS/BLUE SHIELD | Attending: Emergency Medicine | Admitting: Emergency Medicine

## 2018-01-16 ENCOUNTER — Encounter (HOSPITAL_COMMUNITY): Payer: Self-pay | Admitting: Emergency Medicine

## 2018-01-16 DIAGNOSIS — R5383 Other fatigue: Secondary | ICD-10-CM | POA: Insufficient documentation

## 2018-01-16 DIAGNOSIS — R531 Weakness: Secondary | ICD-10-CM | POA: Diagnosis not present

## 2018-01-16 DIAGNOSIS — Z91010 Allergy to peanuts: Secondary | ICD-10-CM | POA: Diagnosis not present

## 2018-01-16 DIAGNOSIS — Z3202 Encounter for pregnancy test, result negative: Secondary | ICD-10-CM | POA: Diagnosis not present

## 2018-01-16 LAB — URINALYSIS, ROUTINE W REFLEX MICROSCOPIC
BILIRUBIN URINE: NEGATIVE
Glucose, UA: NEGATIVE mg/dL
Hgb urine dipstick: NEGATIVE
KETONES UR: NEGATIVE mg/dL
Leukocytes, UA: NEGATIVE
Nitrite: NEGATIVE
PH: 8 (ref 5.0–8.0)
PROTEIN: NEGATIVE mg/dL
Specific Gravity, Urine: 1.004 — ABNORMAL LOW (ref 1.005–1.030)

## 2018-01-16 LAB — HEPATIC FUNCTION PANEL
ALT: 14 U/L (ref 0–44)
AST: 20 U/L (ref 15–41)
Albumin: 3.7 g/dL (ref 3.5–5.0)
Alkaline Phosphatase: 41 U/L (ref 38–126)
BILIRUBIN TOTAL: 0.6 mg/dL (ref 0.3–1.2)
Total Protein: 6.9 g/dL (ref 6.5–8.1)

## 2018-01-16 LAB — CBC
HCT: 38.6 % (ref 36.0–46.0)
Hemoglobin: 12.3 g/dL (ref 12.0–15.0)
MCH: 25.6 pg — ABNORMAL LOW (ref 26.0–34.0)
MCHC: 31.9 g/dL (ref 30.0–36.0)
MCV: 80.2 fL (ref 78.0–100.0)
PLATELETS: 196 10*3/uL (ref 150–400)
RBC: 4.81 MIL/uL (ref 3.87–5.11)
RDW: 12.7 % (ref 11.5–15.5)
WBC: 3.4 10*3/uL — ABNORMAL LOW (ref 4.0–10.5)

## 2018-01-16 LAB — BASIC METABOLIC PANEL
ANION GAP: 9 (ref 5–15)
BUN: 8 mg/dL (ref 6–20)
CALCIUM: 9.6 mg/dL (ref 8.9–10.3)
CO2: 26 mmol/L (ref 22–32)
Chloride: 103 mmol/L (ref 98–111)
Creatinine, Ser: 0.94 mg/dL (ref 0.44–1.00)
GFR calc Af Amer: >60 mL/min (ref >60)
GFR calc non Af Amer: >60 mL/min (ref >60)
GLUCOSE: 87 mg/dL (ref 70–99)
Potassium: 3.8 mmol/L (ref 3.5–5.1)
Sodium: 138 mmol/L (ref 135–145)

## 2018-01-16 LAB — TSH: TSH: 1.627 u[IU]/mL (ref 0.350–4.500)

## 2018-01-16 LAB — POC URINE PREG, ED: Preg Test, Ur: NEGATIVE

## 2018-01-16 NOTE — Discharge Instructions (Signed)
Continue to stay hydrated with water and Gatorade. I have also tried a referral to [facility] please schedule an appointment with them as needed.  If your symptoms worsen or you experience any chest pain, shortness of breath please return to the ED for reevaluation.

## 2018-01-16 NOTE — ED Triage Notes (Signed)
Pt states for the last week she has been feeling weak for over the last week, pt states she had a blood test done on Thursday and was told she had anemia.

## 2018-01-16 NOTE — ED Provider Notes (Signed)
[facility] Note   CSN: [id number] Arrival date & time: 01/16/18  0740     History   Chief Complaint Chief Complaint  Patient presents with  . Fatigue    HPI Ellen Roberts is a 22 y.o. female.  22 y.o female with a PMH of anemia and sickle cell trait presets to the ED with a chief complaint of fatigue and weakness since Thursday. Patient was seen at a clinic on Thursday and reports she was giving over-the-counter iron pills which she is currently taking.  She reports she felt okay yesterday but today she began to feel her legs weekend, cold,left sided constant chest pain. She denies any shortness of breath, abdominal pain, n/v/d or other complaints.  Denies any recent sick contacts.     Past Medical History:  Diagnosis Date  . Anemia   . No pertinent past medical history     There are no active problems to display for this patient.   Past Surgical History:  Procedure Laterality Date  . HERNIA REPAIR       OB History    Gravida  0   Para      Term      Preterm      AB      Living        SAB      TAB      Ectopic      Multiple      Live Births               Home Medications    Prior to Admission medications   Medication Sig Start Date End Date Taking? Authorizing Provider  ferrous sulfate 325 (65 FE) MG tablet Take 325 mg by mouth daily with breakfast.    [provider]  Fiber POWD Take by mouth 2 (two) times daily. Reported on 07/30/2015    [provider]    Family History Family History  Problem Relation Age of Onset  . Diabetes Mother   . Hypertension Mother     Social History Social History   Tobacco Use  . Smoking status: Never Smoker  Substance Use Topics  . Alcohol use: No  . Drug use: No     Allergies   Peanut-containing drug products and Aspirin   Review of Systems Review of Systems  Constitutional: Positive for fatigue. Negative for chills and  fever.  HENT: Negative for ear pain and sore throat.   Eyes: Negative for pain and visual disturbance.  Respiratory: Negative for cough and shortness of breath.   Cardiovascular: Negative for chest pain and palpitations.  Gastrointestinal: Negative for abdominal pain, diarrhea, nausea and vomiting.  Genitourinary: Negative for dysuria and hematuria.  Musculoskeletal: Negative for arthralgias, back pain and myalgias.  Skin: Negative for color change and rash.  Neurological: Positive for weakness. Negative for seizures and syncope.  All other systems reviewed and are negative.    Physical Exam Updated Vital Signs BP 102/76   Pulse 63   Temp 98.7 F (37.1 C) (Oral)   Resp 14   LMP 12/24/2017   SpO2 100%   Physical Exam  Constitutional: She is oriented to person, place, and time. She appears well-developed and well-nourished.  Eyes: Pupils are equal, round, and reactive to light. Right pupil is round and reactive. Left pupil is round and reactive. Pupils are equal.  Neck: Normal range of motion. Neck supple.  Cardiovascular: Normal heart sounds.  Pulmonary/Chest: Effort normal  and breath sounds normal. She has no wheezes.  Abdominal: Soft. Bowel sounds are normal. She exhibits no distension. There is no tenderness.  Musculoskeletal: She exhibits no tenderness or deformity.  Neurological: She is alert and oriented to person, place, and time.  Skin: Skin is warm and dry. There is pallor.  Pale conjunctiva.  Nursing note and vitals reviewed.    ED Treatments / Results  Labs (all labs ordered are listed, but only abnormal results are displayed) Labs Reviewed  CBC - Abnormal; Notable for the following components:      Result Value   WBC 3.4 (*)    MCH 25.6 (*)    All other components within normal limits  URINALYSIS, ROUTINE W REFLEX MICROSCOPIC - Abnormal; Notable for the following components:   Color, Urine STRAW (*)    Specific Gravity, Urine 1.004 (*)    All other  components within normal limits  BASIC METABOLIC PANEL  HEPATIC FUNCTION PANEL  TSH  URINALYSIS, ROUTINE W REFLEX MICROSCOPIC  POC URINE PREG, ED    EKG None  Radiology No results found.  Procedures Procedures (including critical care time)  Medications Ordered in ED Medications - No data to display   Initial Impression / Assessment and Plan / ED Course  I have reviewed the triage vital signs and the nursing notes.  Pertinent labs & imaging results that were available during my care of the patient were reviewed by me and considered in my medical decision making (see chart for details).     She presents with fatigue and weakness since Thursday.  BMP shows no electrolyte abnormality, potassium is 3.8.  She also states that she has been feeling weak and dizzy hepatic function showed no elevation of LFT patient denies any abdominal pain, CBC showed white blood cell count at 3.4, patient is afebrile with no complaints aside from fatigue.  Urinalysis showed no nitrites, leukocytes, white blood cell count.  Urine pregnant was negative.  TSH level was drawn as patient reports fatigue for multiple days level within normal limits at 1.62.  Patient has remained in room with blanket sleeping, I have informed her of this results.  I will provide her with a referral to [facility] health wellness clinic to establish care with a primary care physician for her iron deficiency anemia.  Patient hemoglobin today is stable at 12.3. She currently has no primary care provider I will provide her with a referral to the [facility] health community health and wellness and refer her to establish care to further manage her iron deficiency anemia.  Patient is asymptomatic currently resting in bed with mother and sister at the bedside.  Patient understands and agrees with plan.  Return precautions provided.  Final Clinical Impressions(s) / ED Diagnoses   Final diagnoses:  Fatigue, unspecified type    ED Discharge Orders     None       Claude Manges, PA-C 01/16/18 1233    Arby Barrette, MD 01/18/18 [phone]

## 2018-03-16 ENCOUNTER — Encounter: Payer: Self-pay | Admitting: Physician Assistant

## 2018-04-03 ENCOUNTER — Encounter: Payer: Self-pay | Admitting: Family Medicine

## 2018-04-30 ENCOUNTER — Ambulatory Visit (INDEPENDENT_AMBULATORY_CARE_PROVIDER_SITE_OTHER): Payer: BLUE CROSS/BLUE SHIELD | Admitting: Family Medicine

## 2018-04-30 ENCOUNTER — Other Ambulatory Visit: Payer: Self-pay

## 2018-04-30 ENCOUNTER — Encounter: Payer: Self-pay | Admitting: Family Medicine

## 2018-04-30 VITALS — BP 133/77 | HR 62 | Temp 98.7°F | Ht 63.0 in | Wt 170.4 lb

## 2018-04-30 DIAGNOSIS — N946 Dysmenorrhea, unspecified: Secondary | ICD-10-CM

## 2018-04-30 DIAGNOSIS — M79672 Pain in left foot: Secondary | ICD-10-CM | POA: Diagnosis not present

## 2018-04-30 DIAGNOSIS — R079 Chest pain, unspecified: Secondary | ICD-10-CM

## 2018-04-30 DIAGNOSIS — N644 Mastodynia: Secondary | ICD-10-CM

## 2018-04-30 DIAGNOSIS — M25572 Pain in left ankle and joints of left foot: Secondary | ICD-10-CM

## 2018-04-30 DIAGNOSIS — M7989 Other specified soft tissue disorders: Secondary | ICD-10-CM

## 2018-04-30 NOTE — Progress Notes (Signed)
By signing my name below, I, Schuyler Bain, attest that this documentation has been prepared under the direction and in the presence of Dr. Neva SeatGreene. Electronically Signed: Marcelline MatesSchuyler Bain, Medical Scribe 04/30/18   Subjective:    Patient ID: Ellen Roberts, female    DOB: 09-01-1995, 22 y.o.   MRN: [id number] Chief Complaint  Patient presents with  . Annual Exam    CPE   HPI Ellen Roberts is a 22 y.o. female who presents to Primary Care at [facility] initially for physical, but had multiple other acute/subacute concerns and will defer physical for later date.    left ankle swelling.  The pt notes that her left foot has been swelling since 2015, and notes that it has been evaluated in the past with XR, and has worn compression stockings as well but not since 2017. Normal Left foot XR in June 2016. She saw Deliah BostonMichael Clark, PA in 2017 and was placed on a fluid pill at that time. Pt denies any history of trauma. She has never seen a vascular surgeon. She notes that the swelling extends into her calf since 2 years ago. She denies any history of blood clots. The pt notes that the swelling is constant but gets worse when she stands for a long time or when she doesn't sleep well. Denies calf pain but endorses ankle/foot pain.  She denies having an US of her lower extremities. She also notes that [place] flew to the [place] in November and denies her leg swelling after her flight.   The pt works as a Geophysicist/field seismologistteaching assistant but denies being on her feet most of the day.   The pt also notes that she has had intermittent left breast pain for a few months. She notices this pain randomly and denies any overt associations. She denies breast irregularities, or nodules. She also notes that there is some pain in her chest when she takes a deep breath when it is cold, and denies any fevers. She denies feeling SOB at any time.  No fevers, no recent surgeries.  No cough.  Last period began yesterday and the pt has been experiencing  more pain associated with her periods for the last two years. She notes that the pain stopped sporadically about a year ago, though her periods continued, and then the pain resumed. Her periods last 5 days and she takes Midol for the associated pain.  There are no active problems to display for this patient.  Past Medical History:  Diagnosis Date  . Anemia   . No pertinent past medical history    Past Surgical History:  Procedure Laterality Date  . HERNIA REPAIR     Allergies  Allergen Reactions  . Peanut-Containing Drug Products     Ance   . Aspirin Itching and Rash    Stomach pains   Prior to Admission medications   Medication Sig Start Date End Date Taking? Authorizing Provider  ferrous sulfate 325 (65 FE) MG tablet Take 325 mg by mouth daily with breakfast.   Yes [provider]  Fiber POWD Take by mouth 2 (two) times daily. Reported on 07/30/2015   Yes [provider]   Social History   Socioeconomic History  . Marital status: Single    Spouse name: Not on file  . Number of children: Not on file  . Years of education: Not on file  . Highest education level: Not on file  Occupational History  . Not on file  Social Needs  . Financial  resource strain: Not on file  . Food insecurity:    Worry: Not on file    Inability: Not on file  . Transportation needs:    Medical: Not on file    Non-medical: Not on file  Tobacco Use  . Smoking status: Never Smoker  . Smokeless tobacco: Never Used  Substance and Sexual Activity  . Alcohol use: No  . Drug use: No  . Sexual activity: Never  Lifestyle  . Physical activity:    Days per week: Not on file    Minutes per session: Not on file  . Stress: Not on file  Relationships  . Social connections:    Talks on phone: Not on file    Gets together: Not on file    Attends religious service: Not on file    Active member of club or organization: Not on file    Attends meetings of clubs or organizations: Not on  file    Relationship status: Not on file  . Intimate partner violence:    Fear of current or ex partner: Not on file    Emotionally abused: Not on file    Physically abused: Not on file    Forced sexual activity: Not on file  Other Topics Concern  . Not on file  Social History Narrative  . Not on file   Review of Systems  Constitutional: Negative for fever.  Respiratory: Negative for shortness of breath.   Cardiovascular: Positive for chest pain (With deep breathing) and leg swelling (Left ankle, into calf without pain).  Genitourinary: Positive for pelvic pain (with periods).  Musculoskeletal: Positive for arthralgias (Left ankle) and joint swelling (Left ankle).      Objective:   Physical Exam Constitutional:      Appearance: Normal appearance.  HENT:     Head: Normocephalic and atraumatic.  Cardiovascular:     Rate and Rhythm: Normal rate and regular rhythm.     Pulses: Normal pulses.     Heart sounds: Normal heart sounds.  Pulmonary:     Effort: Pulmonary effort is normal.     Breath sounds: Normal breath sounds.  Chest:     Comments: Breast Exam: Tender to palpation over left breast at 6 o clock without apparent nodule, no skin changes, no apparent axillary LAD Musculoskeletal:     Comments: Calf circumference measured at 15cm below the patella is 38cm on the left, 39cm on the right.  NV distally with cap refill less than 1s Negative Homen's  Calf non-tender   Left ankle exam: Tender along the tib anterior, no malleolar tenderness.  Slight tenderness son proximal fifth, dorsal aspect of the proximal fifth and the navicula. Profuse tender along dorsal fifth. Metatarsals nontender, negative squeeze.   Lymphadenopathy:     Comments: 1+ edema in the LLE to the proximal third of the tibia with prominent swelling of the left ankle extending into the left foot.  Neurological:     Mental Status: She is alert and oriented to person, place, and time.    Vitals:    04/30/18 1551  BP: 133/77  Pulse: 62  Temp: 98.7 F (37.1 C)  TempSrc: Oral  SpO2: 100%  Weight: 170 lb 6.4 oz (77.3 kg)  Height: 5\' 3"  (1.6 m)  Dg Chest 2 View  Result Date: 05/01/2018 CLINICAL DATA:  Intermittent upper left-sided chest discomfort over the past 3 years. No other symptoms. Nonsmoker. EXAM: CHEST - 2 VIEW COMPARISON:  PA and lateral chest x-ray of April 26, 2011 FINDINGS: The lungs are well-expanded and clear. The heart and pulmonary vascularity are normal. The trachea is midline. The bony thorax exhibits no acute abnormality. IMPRESSION: There is no active cardiopulmonary disease. Electronically Signed   By: David  [place] M.D.   On: 05/01/2018 11:25   Dg Ankle Complete Left  Result Date: 05/01/2018 CLINICAL DATA:  Left foot and ankle pain.  Chronic, no known injury. EXAM: LEFT ANKLE COMPLETE - 3+ VIEW COMPARISON:  None. FINDINGS: There is no evidence of fracture, dislocation, or joint effusion. There is no evidence of arthropathy or other focal bone abnormality. Soft tissues are unremarkable. IMPRESSION: Negative. Electronically Signed   By: Charlett Nose M.D.   On: 05/01/2018 11:33   Dg Foot 2 Views Left  Result Date: 05/01/2018 CLINICAL DATA:  Left foot and ankle pain, chronic.  No known injury. EXAM: LEFT FOOT - 2 VIEW COMPARISON:  None. FINDINGS: There is no evidence of fracture or dislocation. There is no evidence of arthropathy or other focal bone abnormality. Soft tissues are unremarkable. IMPRESSION: Negative. Electronically Signed   By: Charlett Nose M.D.   On: 05/01/2018 11:26    EKG: Sinus rhythm rate 55. Flat T wave in III unchanged from 2017, no apparent acute findings.     Assessment & Plan:   Ellen Roberts is a 22 y.o. female Left leg swelling - Plan: CBC, D-dimer, quantitative (not at [facility]), Basic metabolic panel, Sedimentation Rate, Uric Acid Left foot pain - Plan: DG Foot 2 Views Left, Sedimentation Rate, Uric Acid Left ankle pain, unspecified  chronicity - Plan: DG Ankle Complete Left, Sedimentation Rate, Uric Acid  -Longstanding issue, appears to be primarily situated around left foot and ankle.  Differential includes vascular cause or inflammatory process within ankle/foot.  Unlikely DVT given primarily ankle/foot issues, calves are nontender, and longstanding symptoms.  -Check d-dimer, BNP for metabolic causes of peripheral edema, but less likely with unilateral presentation, sed rate and uric acid.  -X-ray results reviewed without any specific concerns.  -Follow-up once these results have returned to determine next step.  Left-sided chest pain - Plan: DG Chest 2 View, EKG 12-Lead  -Atypical/nonexertional and fleeting chest symptoms.  D-dimer obtained as above, but less likely DVT/PE.  EKG without acute concerning findings and chest x-ray reassuring as well.  Plan to follow-up to discuss symptoms further as above  Breast pain, left - Plan: US BREAST LTD UNI LEFT INC AXILLA  -Tender palpation at 6:00 on the left without apparent nodule/skin changes in that area.  Start with ultrasound of left breast, may also need mammogram.  Painful menstruation  -Handout given, over-the-counter treatments okay for now.  Will discuss further at follow-up visit if still persistent    ER/RTC precautions if worsening symptoms. No orders of the defined types were placed in this encounter.  Patient Instructions   I will check some inflammation tests as well as x-rays of your ankle and leg to determine if secondary cause of swelling.  Please follow-up in 2 weeks to review those results and determine next step.  See information on chest pain below, but as that pain occasionally is in the breast I did order an ultrasound as well as x-ray of the chest.  EKG without concerns for heart in the office today.  If any acute worsening of that chest pain, shortness of breath, fevers or other worsening proceed to the emergency room.  Okay to continue same  over-the-counter treatment for menstrual pain for now, but would like  to follow-up to discuss those symptoms further.  Return to the clinic or go to the nearest emergency room if any of your symptoms worsen or new symptoms occur.   Dysmenorrhea  Dysmenorrhea refers to cramps caused by the muscles of the uterus tightening (contracting) during a menstrual period. Dysmenorrhea may be mild, or it may be severe enough to interfere with everyday activities for a few days each month. Primary dysmenorrhea is menstrual cramps that last a couple of days when you start having menstrual periods or soon after. This often begins after a teenager starts having her period. As a woman gets older or has a baby, the cramps will usually lessen or disappear. Secondary dysmenorrhea begins later in life and is caused by a disorder in the reproductive system. It lasts longer, and it may cause more pain than primary dysmenorrhea. The pain may start before the period and last a few days after the period. What are the causes? Dysmenorrhea is usually caused by an underlying problem, such as:  The tissue that lines the uterus (endometrium) growing outside of the uterus in other areas of the body (endometriosis).  Endometrial tissue growing into the muscular walls of the uterus (adenomyosis).  Blood vessels in the pelvis becoming filled with blood just before the menstrual period (pelvic congestive syndrome).  Overgrowth of cells (polyps) in the endometrium or the lower part of the uterus (cervix).  The uterus dropping down into the vagina (prolapse) due to stretched or weak muscles.  Bladder problems, such as infection or inflammation.  Intestinal problems, such as a tumor or irritable bowel syndrome.  Cancer of the reproductive organs or bladder.  A severely tipped uterus.  A cervix that is closed or has a very small opening.  Noncancerous (benign) tumors of the uterus (fibroids).  Pelvic inflammatory disease  (PID).  Pelvic scarring (adhesions) from a previous surgery.  An ovarian cyst.  An IUD (intrauterine device). What increases the risk? You are more likely to develop this condition if:  You are younger than age 39.  You started puberty early.  You have irregular or heavy bleeding.  You have never given birth.  You have a family history of dysmenorrhea.  You smoke. What are the signs or symptoms? Symptoms of this condition include:  Cramping, throbbing pain, or a feeling of fullness in the lower abdomen.  Lower back pain.  Periods lasting for longer than 7 days.  Headaches.  Bloating.  Fatigue.  Nausea or vomiting.  Diarrhea.  Sweating or dizziness.  Loose stools. How is this diagnosed? This condition may be diagnosed based on:  Your symptoms.  Your medical history.  A physical exam.  Blood tests.  A Pap test. This is a test in which cells from the cervix are tested for signs of cancer or infection.  A pregnancy test.  Imaging tests, such as: ? Ultrasound. ? A procedure to remove and examine a sample of endometrial tissue (dilation and curettage, D&C). ? A procedure to visually examine the inside of:  The uterus (hysteroscopy).  The abdomen or pelvis (laparoscopy).  The bladder (cystoscopy).  The intestine (colonoscopy).  The stomach (gastroscopy). ? X-rays. ? CT scan. ? MRI. How is this treated? Treatment depends on the cause of the dysmenorrhea. Treatment may include:  Pain medicine prescribed by your health care provider.  Birth control pills that contain the hormone progesterone.  An IUD that contains the hormone progesterone.  Medicines to control bleeding.  Hormone replacement therapy.  NSAIDs. These may  help to stop the production of hormones that cause cramps.  Antidepressant medicines.  Surgery to remove adhesions, endometriosis, ovarian cysts, fibroids, or the entire uterus (hysterectomy).  Injections of  progesterone to stop the menstrual period.  A procedure to destroy the endometrium (endometrial ablation).  A procedure to cut the nerves in the bottom of the spine (sacrum) that go to the reproductive organs (presacral neurectomy).  A procedure to apply an electric current to nerves in the sacrum (sacral nerve stimulation).  Exercise and physical therapy.  Meditation and yoga therapy.  Acupuncture. Work with your health care provider to determine what treatment or combination of treatments is best for you. Follow these instructions at home: Relieving pain and cramping  Apply heat to your lower back or abdomen when you experience pain or cramps. Use the heat source that your health care provider recommends, such as a moist heat pack or a heating pad. ? Place a towel between your skin and the heat source. ? Leave the heat on for 20-30 minutes. ? Remove the heat if your skin turns bright red. This is especially important if you are unable to feel pain, heat, or cold. You may have a greater risk of getting burned. ? Do not sleep with a heating pad on.  Do aerobic exercises, such as walking, swimming, or biking. This can help to relieve cramps.  Massage your lower back or abdomen to help relieve pain. General instructions  Take over-the-counter and prescription medicines only as told by your health care provider.  Do not drive or use heavy machinery while taking prescription pain medicine.  Avoid alcohol and caffeine during and right before your menstrual period. These can make cramps worse.  Do not use any products that contain nicotine or tobacco, such as cigarettes and e-cigarettes. If you need help quitting, ask your health care provider.  Keep all follow-up visits as told by your health care provider. This is important. Contact a health care provider if:  You have pain that gets worse or does not get better with medicine.  You have pain with sex.  You develop nausea or  vomiting with your period that is not controlled with medicine. Get help right away if:  You faint. Summary  Dysmenorrhea refers to cramps caused by the muscles of the uterus tightening (contracting) during a menstrual period.  Dysmenorrhea may be mild, or it may be severe enough to interfere with everyday activities for a few days each month.  Treatment depends on the cause of the dysmenorrhea.  Work with your health care provider to determine what treatment or combination of treatments is best for you. This information is not intended to replace advice given to you by your health care provider. Make sure you discuss any questions you have with your health care provider. Document Released: 04/18/2005 Document Revised: 05/21/2016 Document Reviewed: 05/21/2016 Elsevier Interactive Patient Education  2019 Elsevier Inc.   Nonspecific Chest Pain Chest pain can be caused by many different conditions. Some causes of chest pain can be life-threatening. These will require treatment right away. Serious causes of chest pain include:  Heart attack.  A tear in the body's main blood vessel.  Redness and swelling (inflammation) around your heart.  Blood clot in your lungs. Other causes of chest pain may not be so serious. These include:  Heartburn.  Anxiety or stress.  Damage to bones or muscles in your chest.  Lung infections. Chest pain can feel like:  Pain or discomfort in your chest.  Crushing, pressure, aching, or squeezing pain.  Burning or tingling.  Dull or sharp pain that is worse when you move, cough, or take a deep breath.  Pain or discomfort that is also felt in your back, neck, jaw, shoulder, or arm, or pain that spreads to any of these areas. It is hard to know whether your pain is caused by something that is serious or something that is not so serious. So it is important to see your doctor right away if you have chest pain. Follow these instructions at  home: Medicines  Take over-the-counter and prescription medicines only as told by your doctor.  If you were prescribed an antibiotic medicine, take it as told by your doctor. Do not stop taking the antibiotic even if you start to feel better. Lifestyle   Rest as told by your doctor.  Do not use any products that contain nicotine or tobacco, such as cigarettes, e-cigarettes, and chewing tobacco. If you need help quitting, ask your doctor.  Do not drink alcohol.  Make lifestyle changes as told by your doctor. These may include: ? Getting regular exercise. Ask your doctor what activities are safe for you. ? Eating a heart-healthy diet. A diet and nutrition specialist (dietitian) can help you to learn healthy eating options. ? Staying at a healthy weight. ? Treating diabetes or high blood pressure, if needed. ? Lowering your stress. Activities such as yoga and relaxation techniques can help. General instructions  Pay attention to any changes in your symptoms. Tell your doctor about them or any new symptoms.  Avoid any activities that cause chest pain.  Keep all follow-up visits as told by your doctor. This is important. You may need more testing if your chest pain does not go away. Contact a doctor if:  Your chest pain does not go away.  You feel depressed.  You have a fever. Get help right away if:  Your chest pain is worse.  You have a cough that gets worse, or you cough up blood.  You have very bad (severe) pain in your belly (abdomen).  You pass out (faint).  You have either of these for no clear reason: ? Sudden chest discomfort. ? Sudden discomfort in your arms, back, neck, or jaw.  You have shortness of breath at any time.  You suddenly start to sweat, or your skin gets clammy.  You feel sick to your stomach (nauseous).  You throw up (vomit).  You suddenly feel lightheaded or dizzy.  You feel very weak or tired.  Your heart starts to beat fast, or it  feels like it is skipping beats. These symptoms may be an emergency. Do not wait to see if the symptoms will go away. Get medical help right away. Call your local emergency services (911 in the [place] Do not drive yourself to the hospital. Summary  Chest pain can be caused by many different conditions. The cause may be serious and need treatment right away. If you have chest pain, see your doctor right away.  Follow your doctor's instructions for taking medicines and making lifestyle changes.  Keep all follow-up visits as told by your doctor. This includes visits for any further testing if your chest pain does not go away.  Be sure to know the signs that show that your condition has become worse. Get help right away if you have these symptoms. This information is not intended to replace advice given to you by your health care provider. Make sure you discuss  any questions you have with your health care provider. Document Released: 10/05/2007 Document Revised: 10/19/2017 Document Reviewed: 10/19/2017 Elsevier Interactive Patient Education  2019 Elsevier Inc.  Peripheral Edema  Peripheral edema is swelling that is caused by a buildup of fluid. Peripheral edema most often affects the lower legs, ankles, and feet. It can also develop in the arms, hands, and face. The area of the body that has peripheral edema will look swollen. It may also feel heavy or warm. Your clothes may start to feel tight. Pressing on the area may make a temporary dent in your skin. You may not be able to move your arm or leg as much as usual. There are many causes of peripheral edema. It can be a complication of other diseases, such as congestive heart failure, kidney disease, or a problem with your blood circulation. It also can be a side effect of certain medicines. It often happens to women during pregnancy. Sometimes, the cause is not known. Treating the underlying condition is often the only treatment for peripheral  edema. Follow these instructions at home: Pay attention to any changes in your symptoms. Take these actions to help with your discomfort:  Raise (elevate) your legs while you are sitting or lying down.  Move around often to prevent stiffness and to lessen swelling. Do not sit or stand for long periods of time.  Wear support stockings as told by your health care provider.  Follow instructions from your health care provider about limiting salt (sodium) in your diet. Sometimes eating less salt can reduce swelling.  Take over-the-counter and prescription medicines only as told by your health care provider. Your health care provider may prescribe medicine to help your body get rid of excess water (diuretic).  Keep all follow-up visits as told by your health care provider. This is important. Contact a health care provider if:  You have a fever.  Your edema starts suddenly or is getting worse, especially if you are pregnant or have a medical condition.  You have swelling in only one leg.  You have increased swelling and pain in your legs. Get help right away if:  You develop shortness of breath, especially when you are lying down.  You have pain in your chest or abdomen.  You feel weak.  You faint. This information is not intended to replace advice given to you by your health care provider. Make sure you discuss any questions you have with your health care provider. Document Released: 05/26/2004 Document Revised: 09/21/2015 Document Reviewed: 10/29/2014 Elsevier Interactive Patient Education  [employer].     If you have lab work done today you will be contacted with your lab results within the next 2 weeks.  If you have not heard from [place] then please contact us. The fastest way to get your results is to register for My Chart.   IF you received an x-ray today, you will receive an invoice from [facility]. Please contact [facility] at [phone] with  questions or concerns regarding your invoice.   IF you received labwork today, you will receive an invoice from [place]. Please contact [facility] at [phone] with questions or concerns regarding your invoice.   Our billing staff will not be able to assist you with questions regarding bills from these companies.  You will be contacted with the lab results as soon as they are available. The fastest way to get your results is to activate your My Chart account. Instructions are located on the last  page of this paperwork. If you have not heard from us regarding the results in 2 weeks, please contact this office.       I personally performed the services described in this documentation, which was scribed in my presence. The recorded information has been reviewed and considered for accuracy and completeness, addended by me as needed, and agree with information above.  Signed,   Meredith StaggersJeffrey Yoltzin Barg, MD Primary Care at [facility].  05/02/18 12:46 PM

## 2018-04-30 NOTE — Patient Instructions (Addendum)
I will check some inflammation tests as well as x-rays of your ankle and leg to determine if secondary cause of swelling.  Please follow-up in 2 weeks to review those results and determine next step.  See information on chest pain below, but as that pain occasionally is in the breast I did order an ultrasound as well as x-ray of the chest.  EKG without concerns for heart in the office today.  If any acute worsening of that chest pain, shortness of breath, fevers or other worsening proceed to the emergency room.  Okay to continue same over-the-counter treatment for menstrual pain for now, but would like to follow-up to discuss those symptoms further.  Return to the clinic or go to the nearest emergency room if any of your symptoms worsen or new symptoms occur.   Dysmenorrhea  Dysmenorrhea refers to cramps caused by the muscles of the uterus tightening (contracting) during a menstrual period. Dysmenorrhea may be mild, or it may be severe enough to interfere with everyday activities for a few days each month. Primary dysmenorrhea is menstrual cramps that last a couple of days when you start having menstrual periods or soon after. This often begins after a teenager starts having her period. As a woman gets older or has a baby, the cramps will usually lessen or disappear. Secondary dysmenorrhea begins later in life and is caused by a disorder in the reproductive system. It lasts longer, and it may cause more pain than primary dysmenorrhea. The pain may start before the period and last a few days after the period. What are the causes? Dysmenorrhea is usually caused by an underlying problem, such as:  The tissue that lines the uterus (endometrium) growing outside of the uterus in other areas of the body (endometriosis).  Endometrial tissue growing into the muscular walls of the uterus (adenomyosis).  Blood vessels in the pelvis becoming filled with blood just before the menstrual period (pelvic  congestive syndrome).  Overgrowth of cells (polyps) in the endometrium or the lower part of the uterus (cervix).  The uterus dropping down into the vagina (prolapse) due to stretched or weak muscles.  Bladder problems, such as infection or inflammation.  Intestinal problems, such as a tumor or irritable bowel syndrome.  Cancer of the reproductive organs or bladder.  A severely tipped uterus.  A cervix that is closed or has a very small opening.  Noncancerous (benign) tumors of the uterus (fibroids).  Pelvic inflammatory disease (PID).  Pelvic scarring (adhesions) from a previous surgery.  An ovarian cyst.  An IUD (intrauterine device). What increases the risk? You are more likely to develop this condition if:  You are younger than age 32.  You started puberty early.  You have irregular or heavy bleeding.  You have never given birth.  You have a family history of dysmenorrhea.  You smoke. What are the signs or symptoms? Symptoms of this condition include:  Cramping, throbbing pain, or a feeling of fullness in the lower abdomen.  Lower back pain.  Periods lasting for longer than 7 days.  Headaches.  Bloating.  Fatigue.  Nausea or vomiting.  Diarrhea.  Sweating or dizziness.  Loose stools. How is this diagnosed? This condition may be diagnosed based on:  Your symptoms.  Your medical history.  A physical exam.  Blood tests.  A Pap test. This is a test in which cells from the cervix are tested for signs of cancer or infection.  A pregnancy test.  Imaging tests, such as: ?  Ultrasound. ? A procedure to remove and examine a sample of endometrial tissue (dilation and curettage, D&C). ? A procedure to visually examine the inside of:  The uterus (hysteroscopy).  The abdomen or pelvis (laparoscopy).  The bladder (cystoscopy).  The intestine (colonoscopy).  The stomach (gastroscopy). ? X-rays. ? CT scan. ? MRI. How is this  treated? Treatment depends on the cause of the dysmenorrhea. Treatment may include:  Pain medicine prescribed by your health care provider.  Birth control pills that contain the hormone progesterone.  An IUD that contains the hormone progesterone.  Medicines to control bleeding.  Hormone replacement therapy.  NSAIDs. These may help to stop the production of hormones that cause cramps.  Antidepressant medicines.  Surgery to remove adhesions, endometriosis, ovarian cysts, fibroids, or the entire uterus (hysterectomy).  Injections of progesterone to stop the menstrual period.  A procedure to destroy the endometrium (endometrial ablation).  A procedure to cut the nerves in the bottom of the spine (sacrum) that go to the reproductive organs (presacral neurectomy).  A procedure to apply an electric current to nerves in the sacrum (sacral nerve stimulation).  Exercise and physical therapy.  Meditation and yoga therapy.  Acupuncture. Work with your health care provider to determine what treatment or combination of treatments is best for you. Follow these instructions at home: Relieving pain and cramping  Apply heat to your lower back or abdomen when you experience pain or cramps. Use the heat source that your health care provider recommends, such as a moist heat pack or a heating pad. ? Place a towel between your skin and the heat source. ? Leave the heat on for 20-30 minutes. ? Remove the heat if your skin turns bright red. This is especially important if you are unable to feel pain, heat, or cold. You may have a greater risk of getting burned. ? Do not sleep with a heating pad on.  Do aerobic exercises, such as walking, swimming, or biking. This can help to relieve cramps.  Massage your lower back or abdomen to help relieve pain. General instructions  Take over-the-counter and prescription medicines only as told by your health care provider.  Do not drive or use heavy  machinery while taking prescription pain medicine.  Avoid alcohol and caffeine during and right before your menstrual period. These can make cramps worse.  Do not use any products that contain nicotine or tobacco, such as cigarettes and e-cigarettes. If you need help quitting, ask your health care provider.  Keep all follow-up visits as told by your health care provider. This is important. Contact a health care provider if:  You have pain that gets worse or does not get better with medicine.  You have pain with sex.  You develop nausea or vomiting with your period that is not controlled with medicine. Get help right away if:  You faint. Summary  Dysmenorrhea refers to cramps caused by the muscles of the uterus tightening (contracting) during a menstrual period.  Dysmenorrhea may be mild, or it may be severe enough to interfere with everyday activities for a few days each month.  Treatment depends on the cause of the dysmenorrhea.  Work with your health care provider to determine what treatment or combination of treatments is best for you. This information is not intended to replace advice given to you by your health care provider. Make sure you discuss any questions you have with your health care provider. Document Released: 04/18/2005 Document Revised: 05/21/2016 Document Reviewed: 05/21/2016 Elsevier  Interactive Patient Education  2019 Elsevier Inc.   Nonspecific Chest Pain Chest pain can be caused by many different conditions. Some causes of chest pain can be life-threatening. These will require treatment right away. Serious causes of chest pain include:  Heart attack.  A tear in the body's main blood vessel.  Redness and swelling (inflammation) around your heart.  Blood clot in your lungs. Other causes of chest pain may not be so serious. These include:  Heartburn.  Anxiety or stress.  Damage to bones or muscles in your chest.  Lung infections. Chest pain can  feel like:  Pain or discomfort in your chest.  Crushing, pressure, aching, or squeezing pain.  Burning or tingling.  Dull or sharp pain that is worse when you move, cough, or take a deep breath.  Pain or discomfort that is also felt in your back, neck, jaw, shoulder, or arm, or pain that spreads to any of these areas. It is hard to know whether your pain is caused by something that is serious or something that is not so serious. So it is important to see your doctor right away if you have chest pain. Follow these instructions at home: Medicines  Take over-the-counter and prescription medicines only as told by your doctor.  If you were prescribed an antibiotic medicine, take it as told by your doctor. Do not stop taking the antibiotic even if you start to feel better. Lifestyle   Rest as told by your doctor.  Do not use any products that contain nicotine or tobacco, such as cigarettes, e-cigarettes, and chewing tobacco. If you need help quitting, ask your doctor.  Do not drink alcohol.  Make lifestyle changes as told by your doctor. These may include: ? Getting regular exercise. Ask your doctor what activities are safe for you. ? Eating a heart-healthy diet. A diet and nutrition specialist (dietitian) can help you to learn healthy eating options. ? Staying at a healthy weight. ? Treating diabetes or high blood pressure, if needed. ? Lowering your stress. Activities such as yoga and relaxation techniques can help. General instructions  Pay attention to any changes in your symptoms. Tell your doctor about them or any new symptoms.  Avoid any activities that cause chest pain.  Keep all follow-up visits as told by your doctor. This is important. You may need more testing if your chest pain does not go away. Contact a doctor if:  Your chest pain does not go away.  You feel depressed.  You have a fever. Get help right away if:  Your chest pain is worse.  You have a cough  that gets worse, or you cough up blood.  You have very bad (severe) pain in your belly (abdomen).  You pass out (faint).  You have either of these for no clear reason: ? Sudden chest discomfort. ? Sudden discomfort in your arms, back, neck, or jaw.  You have shortness of breath at any time.  You suddenly start to sweat, or your skin gets clammy.  You feel sick to your stomach (nauseous).  You throw up (vomit).  You suddenly feel lightheaded or dizzy.  You feel very weak or tired.  Your heart starts to beat fast, or it feels like it is skipping beats. These symptoms may be an emergency. Do not wait to see if the symptoms will go away. Get medical help right away. Call your local emergency services (911 in the [place] Do not drive yourself to the hospital. Summary  Chest pain can be caused by many different conditions. The cause may be serious and need treatment right away. If you have chest pain, see your doctor right away.  Follow your doctor's instructions for taking medicines and making lifestyle changes.  Keep all follow-up visits as told by your doctor. This includes visits for any further testing if your chest pain does not go away.  Be sure to know the signs that show that your condition has become worse. Get help right away if you have these symptoms. This information is not intended to replace advice given to you by your health care provider. Make sure you discuss any questions you have with your health care provider. Document Released: 10/05/2007 Document Revised: 10/19/2017 Document Reviewed: 10/19/2017 Elsevier Interactive Patient Education  2019 Elsevier Inc.  Peripheral Edema  Peripheral edema is swelling that is caused by a buildup of fluid. Peripheral edema most often affects the lower legs, ankles, and feet. It can also develop in the arms, hands, and face. The area of the body that has peripheral edema will look swollen. It may also feel heavy or warm. Your  clothes may start to feel tight. Pressing on the area may make a temporary dent in your skin. You may not be able to move your arm or leg as much as usual. There are many causes of peripheral edema. It can be a complication of other diseases, such as congestive heart failure, kidney disease, or a problem with your blood circulation. It also can be a side effect of certain medicines. It often happens to women during pregnancy. Sometimes, the cause is not known. Treating the underlying condition is often the only treatment for peripheral edema. Follow these instructions at home: Pay attention to any changes in your symptoms. Take these actions to help with your discomfort:  Raise (elevate) your legs while you are sitting or lying down.  Move around often to prevent stiffness and to lessen swelling. Do not sit or stand for long periods of time.  Wear support stockings as told by your health care provider.  Follow instructions from your health care provider about limiting salt (sodium) in your diet. Sometimes eating less salt can reduce swelling.  Take over-the-counter and prescription medicines only as told by your health care provider. Your health care provider may prescribe medicine to help your body get rid of excess water (diuretic).  Keep all follow-up visits as told by your health care provider. This is important. Contact a health care provider if:  You have a fever.  Your edema starts suddenly or is getting worse, especially if you are pregnant or have a medical condition.  You have swelling in only one leg.  You have increased swelling and pain in your legs. Get help right away if:  You develop shortness of breath, especially when you are lying down.  You have pain in your chest or abdomen.  You feel weak.  You faint. This information is not intended to replace advice given to you by your health care provider. Make sure you discuss any questions you have with your health care  provider. Document Released: 05/26/2004 Document Revised: 09/21/2015 Document Reviewed: 10/29/2014 Elsevier Interactive Patient Education  [employer]2019 Elsevier Inc.     If you have lab work done today you will be contacted with your lab results within the next 2 weeks.  If you have not heard from us then please contact us. The fastest way to get your results is to register for My  Chart.   IF you received an x-ray today, you will receive an invoice from [facility]. Please contact [facility] at [phone] with questions or concerns regarding your invoice.   IF you received labwork today, you will receive an invoice from [facility]. Please contact [facility] at [phone] with questions or concerns regarding your invoice.   Our billing staff will not be able to assist you with questions regarding bills from these companies.  You will be contacted with the lab results as soon as they are available. The fastest way to get your results is to activate your My Chart account. Instructions are located on the last page of this paperwork. If you have not heard from [place] regarding the results in 2 weeks, please contact this office.

## 2018-05-01 ENCOUNTER — Ambulatory Visit
Admission: RE | Admit: 2018-05-01 | Discharge: 2018-05-01 | Disposition: A | Discharge status: Home / Self Care | Payer: BLUE CROSS/BLUE SHIELD | Source: Ambulatory Visit | Attending: Family Medicine | Admitting: Family Medicine

## 2018-05-01 DIAGNOSIS — M79672 Pain in left foot: Secondary | ICD-10-CM

## 2018-05-01 DIAGNOSIS — M25572 Pain in left ankle and joints of left foot: Secondary | ICD-10-CM

## 2018-05-01 DIAGNOSIS — R079 Chest pain, unspecified: Secondary | ICD-10-CM

## 2018-05-01 LAB — CBC
HEMATOCRIT: 38.6 % (ref 34.0–46.6)
HEMOGLOBIN: 12 g/dL (ref 11.1–15.9)
MCH: 24.7 pg — AB (ref 26.6–33.0)
MCHC: 31.1 g/dL — AB (ref 31.5–35.7)
MCV: 80 fL (ref 79–97)
Platelets: 231 10*3/uL (ref 150–450)
RBC: 4.85 x10E6/uL (ref 3.77–5.28)
RDW: 12.8 % (ref 12.3–15.4)
WBC: 3.6 10*3/uL (ref 3.4–10.8)

## 2018-05-01 LAB — D-DIMER, QUANTITATIVE (NOT AT ARMC): D-DIMER: 0.36 mg{FEU}/L (ref 0.00–0.49)

## 2018-05-01 LAB — BASIC METABOLIC PANEL
BUN / CREAT RATIO: 7 — AB (ref 9–23)
BUN: 7 mg/dL (ref 6–20)
CALCIUM: 10.1 mg/dL (ref 8.7–10.2)
CHLORIDE: 107 mmol/L — AB (ref 96–106)
CO2: 19 mmol/L — ABNORMAL LOW (ref 20–29)
CREATININE: 0.95 mg/dL (ref 0.57–1.00)
GFR calc non Af Amer: 84 mL/min/{1.73_m2} (ref >59)
GFR, EST AFRICAN AMERICAN: 97 mL/min/{1.73_m2} (ref >59)
Glucose: 78 mg/dL (ref 65–99)
Potassium: 3.9 mmol/L (ref 3.5–5.2)
Sodium: 143 mmol/L (ref 134–144)

## 2018-05-01 LAB — URIC ACID: Uric Acid: 4.5 mg/dL (ref 2.5–7.1)

## 2018-05-01 LAB — SEDIMENTATION RATE: SED RATE: 12 mm/h (ref 0–32)

## 2018-05-04 ENCOUNTER — Ambulatory Visit
Admission: RE | Admit: 2018-05-04 | Discharge: 2018-05-04 | Disposition: A | Discharge status: Home / Self Care | Payer: BLUE CROSS/BLUE SHIELD | Source: Ambulatory Visit | Attending: Family Medicine | Admitting: Family Medicine

## 2018-05-04 DIAGNOSIS — N644 Mastodynia: Secondary | ICD-10-CM

## 2018-05-11 ENCOUNTER — Other Ambulatory Visit: Payer: BLUE CROSS/BLUE SHIELD

## 2018-05-18 ENCOUNTER — Ambulatory Visit: Payer: Self-pay | Admitting: Family Medicine

## 2018-05-22 ENCOUNTER — Ambulatory Visit (INDEPENDENT_AMBULATORY_CARE_PROVIDER_SITE_OTHER): Payer: BLUE CROSS/BLUE SHIELD | Admitting: Family Medicine

## 2018-05-22 ENCOUNTER — Encounter: Payer: Self-pay | Admitting: Family Medicine

## 2018-05-22 ENCOUNTER — Other Ambulatory Visit: Payer: Self-pay

## 2018-05-22 VITALS — BP 133/78 | HR 66 | Temp 98.9°F | Resp 17 | Ht 63.0 in | Wt 165.8 lb

## 2018-05-22 DIAGNOSIS — M79672 Pain in left foot: Secondary | ICD-10-CM | POA: Diagnosis not present

## 2018-05-22 DIAGNOSIS — M25472 Effusion, left ankle: Secondary | ICD-10-CM | POA: Diagnosis not present

## 2018-05-22 NOTE — Patient Instructions (Signed)
° ° ° °  If you have lab work done today you will be contacted with your lab results within the next 2 weeks.  If you have not heard from us then please contact us. The fastest way to get your results is to register for My Chart. ° ° °IF you received an x-ray today, you will receive an invoice from [facility]. Please contact [facility] at [phone] with questions or concerns regarding your invoice.  ° °IF you received labwork today, you will receive an invoice from [facility]. Please contact [facility] at [phone] with questions or concerns regarding your invoice.  ° °Our billing staff will not be able to assist you with questions regarding bills from these companies. ° °You will be contacted with the lab results as soon as they are available. The fastest way to get your results is to activate your My Chart account. Instructions are located on the last page of this paperwork. If you have not heard from us regarding the results in 2 weeks, please contact this office. °  ° ° ° °

## 2018-05-22 NOTE — Progress Notes (Signed)
Established Patient Office Visit  Subjective:  Patient ID: Ellen Roberts, female    DOB: 03-05-1996  Age: 23 y.o. MRN: [id number]  CC:  Chief Complaint  Patient presents with  . left leg swelling    and chest symptoms per dr Neva Seatgreene he wanted pt to be rechecked (xray)    HPI Ellen Roberts presents for   Pt following up from visit with Dr. Neva SeatGreene 04/30/2018 She states that her ankle swelling on the left is intermittent She denies any swelling today She reports that she gets swelling if she stands for long periods of time or if she does not get much sleep She denies any relationship to her period cycles This has been ongoing for 4 years  Past Medical History:  Diagnosis Date  . Anemia   . No pertinent past medical history     Past Surgical History:  Procedure Laterality Date  . HERNIA REPAIR      Family History  Problem Relation Age of Onset  . Diabetes Mother   . Hypertension Mother     Social History   Socioeconomic History  . Marital status: Single    Spouse name: Not on file  . Number of children: Not on file  . Years of education: Not on file  . Highest education level: Not on file  Occupational History  . Not on file  Social Needs  . Financial resource strain: Not on file  . Food insecurity:    Worry: Not on file    Inability: Not on file  . Transportation needs:    Medical: Not on file    Non-medical: Not on file  Tobacco Use  . Smoking status: Never Smoker  . Smokeless tobacco: Never Used  Substance and Sexual Activity  . Alcohol use: No  . Drug use: No  . Sexual activity: Never  Lifestyle  . Physical activity:    Days per week: Not on file    Minutes per session: Not on file  . Stress: Not on file  Relationships  . Social connections:    Talks on phone: Not on file    Gets together: Not on file    Attends religious service: Not on file    Active member of club or organization: Not on file    Attends meetings of clubs or organizations:  Not on file    Relationship status: Not on file  . Intimate partner violence:    Fear of current or ex partner: Not on file    Emotionally abused: Not on file    Physically abused: Not on file    Forced sexual activity: Not on file  Other Topics Concern  . Not on file  Social History Narrative  . Not on file    Outpatient Medications Prior to Visit  Medication Sig Dispense Refill  . ferrous sulfate 325 (65 FE) MG tablet Take 325 mg by mouth daily with breakfast.    . Fiber POWD Take by mouth 2 (two) times daily. Reported on 07/30/2015     No facility-administered medications prior to visit.     Allergies  Allergen Reactions  . Peanut-Containing Drug Products     Ance   . Aspirin Itching and Rash    Stomach pains    ROS Review of Systems  Review of Systems  Constitutional: Negative for activity change, appetite change, chills and fever.  HENT: Negative for congestion, nosebleeds, trouble swallowing and voice change.   Gastrointestinal: Negative for diarrhea, nausea and  vomiting.  Genitourinary: Negative for difficulty urinating, dysuria, flank pain and hematuria.  Musculoskeletal: Negative for back pain, joint swelling and neck pain.  Neurological: Negative for dizziness, speech difficulty, light-headedness and numbness.  See HPI. All other review of systems negative.      Objective:    Physical Exam  Constitutional: She is oriented to person, place, and time. She appears well-developed and well-nourished.  HENT:  Head: Normocephalic and atraumatic.  Eyes: Conjunctivae and EOM are normal.  Cardiovascular: Normal rate, regular rhythm and normal heart sounds.  Pulmonary/Chest: Effort normal and breath sounds normal.  Musculoskeletal: Normal range of motion.        General: No deformity or edema.  Neurological: She is alert and oriented to person, place, and time.  Skin: Skin is warm. No erythema.  Psychiatric: She has a normal mood and affect. Her behavior is  normal. Judgment and thought content normal.    BP 133/78 (BP Location: Right Arm, Patient Position: Sitting, Cuff Size: Normal)   Pulse 66   Temp 98.9 F (37.2 C) (Oral)   Resp 17   Ht 5\' 3"  (1.6 m)   Wt 165 lb 12.8 oz (75.2 kg)   LMP 04/29/2018   SpO2 99%   BMI 29.37 kg/m  Wt Readings from Last 3 Encounters:  05/22/18 165 lb 12.8 oz (75.2 kg)  04/30/18 170 lb 6.4 oz (77.3 kg)  09/23/15 183 lb 12.8 oz (83.4 kg)     Health Maintenance Due  Topic Date Due  . HIV Screening  07/24/2008  . PAP-Cervical Cytology Screening  07/25/2014  . PAP SMEAR-Modifier  07/25/2014    There are no preventive care reminders to display for this patient.  Lab Results  Component Value Date   TSH 1.627 01/16/2018   Lab Results  Component Value Date   WBC 3.6 04/30/2018   HGB 12.0 04/30/2018   HCT 38.6 04/30/2018   MCV 80 04/30/2018   PLT 231 04/30/2018   Lab Results  Component Value Date   NA 143 04/30/2018   K 3.9 04/30/2018   CO2 19 (L) 04/30/2018   GLUCOSE 78 04/30/2018   BUN 7 04/30/2018   CREATININE 0.95 04/30/2018   BILITOT 0.6 01/16/2018   ALKPHOS 41 01/16/2018   AST 20 01/16/2018   ALT 14 01/16/2018   PROT 6.9 01/16/2018   ALBUMIN 3.7 01/16/2018   CALCIUM 10.1 04/30/2018   ANIONGAP 9 01/16/2018   No results found for: CHOL No results found for: HDL No results found for: LDLCALC No results found for: TRIG No results found for: CHOLHDL Lab Results  Component Value Date   HGBA1C 5.3 09/23/2015      Assessment & Plan:   Problem List Items Addressed This Visit    None    Visit Diagnoses    Left foot pain    -  Primary   Relevant Orders   Ambulatory referral to Vascular Surgery   Left ankle swelling       Relevant Orders   Ambulatory referral to Vascular Surgery     This is a 23 yo female with no known history of trauma, autoimmune disorder or inflammatory joint disease who has a 4 YEAR HISTORY of recurrent left foot and ankle swelling and pain.    Differential by Dr. Neva SeatGreene has been narrowed by labs and imaging - not gout - not inflammatory - no clot - no trauma Would like patient evaluated for peripheral vein insufficiency of the left lower extremity. If this can  be done by just ordering an imagining test will change referral.  -  Would like an evaluation for vascular cause If all work up is negative will treat with prn diuretic and compression stocking   No orders of the defined types were placed in this encounter.   Follow-up: No follow-ups on file.    Doristine Bosworth, MD

## 2018-05-23 NOTE — Addendum Note (Signed)
Addended by: Collie SiadSTALLINGS, Sergei Delo A on: 05/23/2018 04:02 PM   Modules accepted: Orders

## 2018-06-08 ENCOUNTER — Encounter (HOSPITAL_COMMUNITY): Payer: BLUE CROSS/BLUE SHIELD

## 2019-05-11 ENCOUNTER — Ambulatory Visit (HOSPITAL_COMMUNITY)
Admission: EM | Admit: 2019-05-11 | Discharge: 2019-05-11 | Disposition: A | Discharge status: Home / Self Care | Payer: 59 | Attending: Emergency Medicine | Admitting: Emergency Medicine

## 2019-05-11 ENCOUNTER — Encounter (HOSPITAL_COMMUNITY): Payer: Self-pay

## 2019-05-11 ENCOUNTER — Other Ambulatory Visit: Payer: Self-pay

## 2019-05-11 ENCOUNTER — Ambulatory Visit (INDEPENDENT_AMBULATORY_CARE_PROVIDER_SITE_OTHER): Payer: 59

## 2019-05-11 DIAGNOSIS — R0789 Other chest pain: Secondary | ICD-10-CM

## 2019-05-11 DIAGNOSIS — Z20822 Contact with and (suspected) exposure to covid-19: Secondary | ICD-10-CM | POA: Diagnosis present

## 2019-05-11 DIAGNOSIS — R05 Cough: Secondary | ICD-10-CM

## 2019-05-11 DIAGNOSIS — Z862 Personal history of diseases of the blood and blood-forming organs and certain disorders involving the immune mechanism: Secondary | ICD-10-CM | POA: Insufficient documentation

## 2019-05-11 LAB — CBC
HCT: 39.2 % (ref 36.0–46.0)
Hemoglobin: 12.6 g/dL (ref 12.0–15.0)
MCH: 25.7 pg — ABNORMAL LOW (ref 26.0–34.0)
MCHC: 32.1 g/dL (ref 30.0–36.0)
MCV: 80 fL (ref 80.0–100.0)
Platelets: 212 10*3/uL (ref 150–400)
RBC: 4.9 MIL/uL (ref 3.87–5.11)
RDW: 13 % (ref 11.5–15.5)
WBC: 3 10*3/uL — ABNORMAL LOW (ref 4.0–10.5)
nRBC: 0 % (ref 0.0–0.2)

## 2019-05-11 MED ORDER — IBUPROFEN 600 MG PO TABS: 600.0000 mg | ORAL_TABLET | Freq: Four times a day (QID) | ORAL | 0 refills | Status: DC | PRN

## 2019-05-11 MED ORDER — BENZONATATE 200 MG PO CAPS: 200.0000 mg | ORAL_CAPSULE | Freq: Three times a day (TID) | ORAL | 0 refills | Status: DC | PRN

## 2019-05-11 NOTE — ED Provider Notes (Signed)
HPI  SUBJECTIVE:  Ellen Roberts is a 24 y.o. female who presents with 5 days of cough productive of white mucus.  She reports loss of smell, states that she has felt feverish, but has not taken her temperature.  She lives with mom who tested positive for Covid today.  She reports 1 night of abdominal pain,  This has resolved.  Reports constant chest soreness primarily on the left secondary to the cough.  And shortness of breath with exertion.  No body aches, headaches, nasal congestion no sore throat, loss of taste, shortness of breath, wheezing, nausea, vomiting, diarrhea, excess fatigue.  No exposure to flu.  Chest pain is not associated with arm movement, torso rotation, exertion.  Patient has history of anemia and states that her hemoglobin always gets low when she is ill, is requesting a CBC.  She denies epistaxis, melena, vaginal bleeding, hematuria, hematemesis, hemoptysis.  No history of asthma, smoking, diabetes, pneumothorax, coronary disease, HIV, cancer, immunocompromise.  LMP: 1/7.  Denies the possibility of being pregnant.  PMD: [facility].    Past Medical History:  Diagnosis Date  . Anemia   . No pertinent past medical history     Past Surgical History:  Procedure Laterality Date  . HERNIA REPAIR      Family History  Problem Relation Age of Onset  . Diabetes Mother   . Hypertension Mother     Social History   Tobacco Use  . Smoking status: Never Smoker  . Smokeless tobacco: Never Used  Substance Use Topics  . Alcohol use: No  . Drug use: No    No current facility-administered medications for this encounter.  Current Outpatient Medications:  .  benzonatate (TESSALON) 200 MG capsule, Take 1 capsule (200 mg total) by mouth 3 (three) times daily as needed for cough., Disp: 30 capsule, Rfl: 0 .  ferrous sulfate 325 (65 FE) MG tablet, Take 325 mg by mouth daily with breakfast., Disp: , Rfl:  .  Fiber POWD, Take by mouth 2 (two) times daily. Reported on  07/30/2015, Disp: , Rfl:  .  ibuprofen (ADVIL) 600 MG tablet, Take 1 tablet (600 mg total) by mouth every 6 (six) hours as needed., Disp: 30 tablet, Rfl: 0  Allergies  Allergen Reactions  . Peanut-Containing Drug Products     Ance   . Aspirin Itching and Rash    Stomach pains     ROS  As noted in HPI.   Physical Exam  BP (!) 101/57 (BP Location: Left Arm)   Pulse 63   Temp 98.9 F (37.2 C) (Oral)   Resp 17   LMP 05/03/2019   SpO2 100%   Constitutional: Well developed, well nourished, no acute distress Eyes:  EOMI, conjunctiva normal bilaterally HENT: Normocephalic, atraumatic,mucus membranes moist.  No nasal congestion.   Respiratory: Normal inspiratory effort, lungs clear bilaterally, good air movement.  No reproducible chest wall tenderness Cardiovascular: Normal rate regular rhythm no murmurs rubs or gallops GI: nondistended skin: No rash, skin intact Musculoskeletal: no deformities Neurologic: Alert & oriented x 3, no focal neuro deficits Psychiatric: Speech and behavior appropriate   ED Course   Medications - No data to display  Orders Placed This Encounter  Procedures  . Novel Coronavirus, NAA (Hosp order, Send-out to Ref Lab; TAT 18-24 hrs    Standing Status:   Standing    Number of Occurrences:   1    Order Specific Question:   Is this test for diagnosis or screening  Answer:   Screening    Order Specific Question:   Symptomatic for COVID-19 as defined by CDC    Answer:   No    Order Specific Question:   Hospitalized for COVID-19    Answer:   No    Order Specific Question:   Admitted to ICU for COVID-19    Answer:   No    Order Specific Question:   Previously tested for COVID-19    Answer:   No    Order Specific Question:   Resident in a congregate (group) care setting    Answer:   No    Order Specific Question:   Employed in healthcare setting    Answer:   No    Order Specific Question:   Pregnant    Answer:   No  . DG Chest 2 View     Standing Status:   Standing    Number of Occurrences:   1    Order Specific Question:   Reason for Exam (SYMPTOM  OR DIAGNOSIS REQUIRED)    Answer:   left sided CP cough suspect covid r/o PNA  . CBC    Standing Status:   Standing    Number of Occurrences:   1    Results for orders placed or performed during the hospital encounter of 05/11/19 (from the past 24 hour(s))  CBC     Status: Abnormal   Collection Time: 05/11/19  4:45 PM  Result Value Ref Range   WBC 3.0 (L) 4.0 - 10.5 K/uL   RBC 4.90 3.87 - 5.11 MIL/uL   Hemoglobin 12.6 12.0 - 15.0 g/dL   HCT 11.9 41.7 - 40.8 %   MCV 80.0 80.0 - 100.0 fL   MCH 25.7 (L) 26.0 - 34.0 pg   MCHC 32.1 30.0 - 36.0 g/dL   RDW 14.4 81.8 - 56.3 %   Platelets 212 150 - 400 K/uL   nRBC 0.0 0.0 - 0.2 %  Novel Coronavirus, NAA (Hosp order, Send-out to Ref Lab; TAT 18-24 hrs     Status: None   Collection Time: 05/11/19  6:55 PM   Specimen: Nasopharyngeal Swab; Respiratory  Result Value Ref Range   SARS-CoV-2, NAA NOT DETECTED NOT DETECTED   Coronavirus Source NASOPHARYNGEAL    DG Chest 2 View  Result Date: 05/11/2019 CLINICAL DATA:  Left-sided chest pain.  Cough.  Suspected COVID 19. EXAM: CHEST - 2 VIEW COMPARISON:  May 01, 2017 FINDINGS: The heart size and mediastinal contours are within normal limits. Both lungs are clear. The visualized skeletal structures are unremarkable. IMPRESSION: No active cardiopulmonary disease. Electronically Signed   By: Gerome Sam III M.D   On: 05/11/2019 17:15    ED Clinical Impression  1. Suspected COVID-19 virus infection   2. History of anemia      ED Assessment/Plan  1.  Suspect Covid.  Will check chest x-ray because of nonreproducible left-sided chest pain to rule out pneumothorax, pneumonia.  Doubt ACS.  Suspect that the chest pain is from coughing.  Plan to send home with 600 mg ibuprofen combined with 1000 g of Tylenol 3-4 times a day as needed for pain, Tessalon.  Reviewed imaging  independently.  Normal chest x-ray. see radiology report for full details.  2.  History of anemia.  Patient requesting CBC, states that her hemoglobin always drops when she is sick.  Will check this.  Advised her that to sign up for MyChart,  will contact her only if results are  abnormal that need immediate action.  She will need to follow-up with her primary care physician at [facility].  CBC reviewed.  Hemoglobin is at baseline which is around 12.  Slightly low white count, no concern for aplastic anemia or neutropenia.  Discussed MDM, treatment plan, and plan for follow-up with patient. patient agrees with plan.   Meds ordered this encounter  Medications  . benzonatate (TESSALON) 200 MG capsule    Sig: Take 1 capsule (200 mg total) by mouth 3 (three) times daily as needed for cough.    Dispense:  30 capsule    Refill:  0  . ibuprofen (ADVIL) 600 MG tablet    Sig: Take 1 tablet (600 mg total) by mouth every 6 (six) hours as needed.    Dispense:  30 tablet    Refill:  0    *This clinic note was created using Scientist, clinical (histocompatibility and immunogenetics). Therefore, there may be occasional mistakes despite careful proofreading.   ?    Domenick Gong, MD 05/12/19 [id number]

## 2019-05-11 NOTE — Discharge Instructions (Addendum)
Your CBC is pending.  I will call you only if the results are abnormal.  Your chest x-ray was normal.  Your Covid PCR test will be back in about 2 days.  We will call you if it comes back positive or you can get the result through MyChart.  Take 600 mg of ibuprofen combined with 1 g of Tylenol 3-4 times a day as needed for pain, Tessalon for the cough.

## 2019-05-11 NOTE — ED Triage Notes (Signed)
Pt presents with non productive cough that is causing some chest discomfort and loss of smell since Monday.

## 2019-05-12 LAB — NOVEL CORONAVIRUS, NAA (HOSP ORDER, SEND-OUT TO REF LAB; TAT 18-24 HRS): SARS-CoV-2, NAA: NOT DETECTED

## 2019-05-27 ENCOUNTER — Encounter (HOSPITAL_COMMUNITY): Payer: Self-pay | Admitting: Emergency Medicine

## 2019-05-27 ENCOUNTER — Other Ambulatory Visit: Payer: Self-pay

## 2019-05-27 ENCOUNTER — Ambulatory Visit (HOSPITAL_COMMUNITY): Payer: PRIVATE HEALTH INSURANCE

## 2019-05-27 ENCOUNTER — Telehealth (HOSPITAL_COMMUNITY): Payer: Self-pay

## 2019-05-27 ENCOUNTER — Ambulatory Visit (HOSPITAL_COMMUNITY)
Admission: EM | Admit: 2019-05-27 | Discharge: 2019-05-27 | Disposition: A | Discharge status: Home / Self Care | Payer: PRIVATE HEALTH INSURANCE | Attending: Nurse Practitioner | Admitting: Nurse Practitioner

## 2019-05-27 DIAGNOSIS — S61309A Unspecified open wound of unspecified finger with damage to nail, initial encounter: Secondary | ICD-10-CM | POA: Diagnosis not present

## 2019-05-27 MED ORDER — IBUPROFEN 800 MG PO TABS: 800.0000 mg | ORAL_TABLET | Freq: Three times a day (TID) | ORAL | 0 refills | Status: DC | PRN

## 2019-05-27 NOTE — Discharge Instructions (Addendum)
  Keep the wound dry for 48 hours. After 48 hours have passed, lightly wash the finger in warm, soapy water twice a day. This helps to reduce pain and swelling. It also prevents infection.: Check your wound every day for signs of infection. Check for: Redness, swelling, or pain. Fluid or blood. Warmth. Pus or a bad smell. Place a small amount of antibiotic ointment to your nail bed and cover with a nonadherent dressing twice a day. These items may be purchased over the counter at any pharmacy.

## 2019-05-27 NOTE — ED Provider Notes (Signed)
[place]    CSN: [id number] Arrival date & time: 05/27/19  1851      History   Chief Complaint Chief Complaint  Patient presents with  . Finger Injury    HPI Ellen Roberts is a 24 y.o. female.   History of Present Illness  Ellen Roberts is a 24 y.o. female that presents for evaluation of an injury to her left finger.  Injury occurred  a few days ago and has been stable since. Patient has on artificial/acrylic nails. She accidentally hit the nail against her shoe causing the nail to partially come off the nail bed. It is painful. No swelling, drainage or redness. She states that the pain is severe especially when she accidentally touches something with that nail.  She has not noticed paresthesias in the hand since the injury.  She has not been seen prior to today for this injury. Care prior to arrival consisted of nothing.        Past Medical History:  Diagnosis Date  . Anemia   . No pertinent past medical history     There are no problems to display for this patient.   Past Surgical History:  Procedure Laterality Date  . HERNIA REPAIR      OB History    Gravida  0   Para      Term      Preterm      AB      Living        SAB      TAB      Ectopic      Multiple      Live Births               Home Medications    Prior to Admission medications   Medication Sig Start Date End Date Taking? Authorizing Provider  benzonatate (TESSALON) 200 MG capsule Take 1 capsule (200 mg total) by mouth 3 (three) times daily as needed for cough. 05/11/19   Melynda Ripple, MD  ferrous sulfate 325 (65 FE) MG tablet Take 325 mg by mouth daily with breakfast.    [provider]  Fiber POWD Take by mouth 2 (two) times daily. Reported on 07/30/2015    [provider]  ibuprofen (ADVIL) 800 MG tablet Take 1 tablet (800 mg total) by mouth every 8 (eight) hours as needed (PAIN). 05/27/19   Enrique Sack, FNP    Family  History Family History  Problem Relation Age of Onset  . Diabetes Mother   . Hypertension Mother     Social History Social History   Tobacco Use  . Smoking status: Never Smoker  . Smokeless tobacco: Never Used  Substance Use Topics  . Alcohol use: No  . Drug use: No     Allergies   Peanut-containing drug products and Aspirin   Review of Systems Review of Systems  Skin: Positive for wound.  All other systems reviewed and are negative.    Physical Exam Triage Vital Signs ED Triage Vitals  Enc Vitals Group     BP 05/27/19 1930 127/79     Pulse Rate 05/27/19 1930 72     Resp 05/27/19 1930 18     Temp 05/27/19 1930 99.5 F (37.5 C)     Temp Source 05/27/19 1930 Oral     SpO2 05/27/19 1930 100 %     Weight --      Height --      Head Circumference --  Peak Flow --      Pain Score 05/27/19 1924 10     Pain Loc --      Pain Edu? --      Excl. in [place]? --    No data found.  Updated Vital Signs BP 127/79 (BP Location: Right Arm)   Pulse 72   Temp 99.5 F (37.5 C) (Oral)   Resp 18   LMP 05/03/2019 (Approximate)   SpO2 100%   Visual Acuity Right Eye Distance:   Left Eye Distance:   Bilateral Distance:    Right Eye Near:   Left Eye Near:    Bilateral Near:     Physical Exam Vitals reviewed.  Constitutional:      Appearance: Normal appearance.  Cardiovascular:     Rate and Rhythm: Normal rate.  Pulmonary:     Effort: Pulmonary effort is normal.  Musculoskeletal:        General: Normal range of motion.       Hands:  Skin:    General: Skin is warm and dry.  Neurological:     General: No focal deficit present.     Mental Status: She is alert and oriented to person, place, and time.  Psychiatric:        Mood and Affect: Mood normal.      UC Treatments / Results  Labs (all labs ordered are listed, but only abnormal results are displayed) Labs Reviewed - No data to display  EKG   Radiology No results found.  Procedures Nail  Removal  Date/Time: 05/27/2019 7:54 PM Performed by: Lurline Idol, FNP Authorized by: Lurline Idol, FNP   Consent:    Consent obtained:  Verbal   Consent given by:  Patient   Risks discussed:  Bleeding, infection, pain and permanent nail deformity   Alternatives discussed:  No treatment Location:    Hand:  L long finger Pre-procedure details:    Skin preparation:  Alcohol Anesthesia (see MAR for exact dosages):    Anesthesia method:  Local infiltration   Local anesthetic:  Lidocaine 2% w/o epi Nail Removal:    Nail removed:  Complete   Nail bed repaired: no     Removed nail replaced and anchored: no   Post-procedure details:    Dressing:  Xeroform gauze   Patient tolerance of procedure:  Tolerated well, no immediate complications   (including critical care time)  Medications Ordered in UC Medications - No data to display  Initial Impression / Assessment and Plan / UC Course  I have reviewed the triage vital signs and the nursing notes.  Pertinent labs & imaging results that were available during my care of the patient were reviewed by me and considered in my medical decision making (see chart for details).    24 yo female presenting with a partial nail avulsion after suddenly hitting her nail against her shoe.  She has on artificial nails.  Nail was completely removed per procedure above without difficulty.  Postprocedural care and return precautions given.  Today's evaluation has revealed no signs of a dangerous process. Discussed diagnosis with patient and/or guardian. Patient and/or guardian aware of their diagnosis, possible red flag symptoms to watch out for and need for close follow up. Patient and/or guardian understands verbal and written discharge instructions. Patient and/or guardian comfortable with plan and disposition.  Patient and/or guardian has a clear mental status at this time, good insight into illness (after discussion and teaching) and has clear  judgment to make decisions  regarding their care  This care was provided during an unprecedented National Emergency due to the Novel Coronavirus (COVID-19) pandemic. COVID-19 infections and transmission risks place heavy strains on healthcare resources.  As this pandemic evolves, our facility, providers, and staff strive to respond fluidly, to remain operational, and to provide care relative to available resources and information. Outcomes are unpredictable and treatments are without well-defined guidelines. Further, the impact of COVID-19 on all aspects of urgent care, including the impact to patients seeking care for reasons other than COVID-19, is unavoidable during this national emergency. At this time of the global pandemic, management of patients has significantly changed, even for non-COVID positive patients given high local and regional COVID volumes at this time requiring high healthcare system and resource utilization. The standard of care for management of both COVID suspected and non-COVID suspected patients continues to change rapidly at the local, regional, national, and global levels. This patient was worked up and treated to the best available but ever changing evidence and resources available at this current time.   Documentation was completed with the aid of voice recognition software. Transcription may contain typographical errors.   Final Clinical Impressions(s) / UC Diagnoses   Final diagnoses:  Traumatic avulsion of nail plate of finger, initial encounter     Discharge Instructions      1. Keep the wound dry for 48 hours. After 48 hours have passed, lightly wash the finger in warm, soapy water twice a day. This helps to reduce pain and swelling. It also prevents infection.: 2. Check your wound every day for signs of infection. Check for: ? Redness, swelling, or pain. ? Fluid or blood. ? Warmth. ? Pus or a bad smell. 3. Place a small amount of antibiotic ointment to your  nail bed and cover with a nonadherent dressing twice a day. These items may be purchased over the counter at any pharmacy.     ED Prescriptions    Medication Sig Dispense Auth. Provider   ibuprofen (ADVIL) 800 MG tablet Take 1 tablet (800 mg total) by mouth every 8 (eight) hours as needed (PAIN). 21 tablet Lurline Idol, FNP     PDMP not reviewed this encounter.   Lurline Idol, [place] 05/27/19 2002

## 2019-05-27 NOTE — ED Triage Notes (Signed)
Patient jammed left middle finger on Friday.  Since then has randomly hit finger on things and pain is worse.  Patient has fake nails, nail is chipped and appears that patient's nail bed is disrupted, visible blood under nail.  Patient has swelling.  Pain is limited to nail.

## 2019-06-19 IMAGING — US ULTRASOUND LEFT BREAST LIMITED
1 series · 4 of 4 positions shown · non-contrast
Comparison: None

CLINICAL DATA: Patient presents for pain within the left breast
superiorly and inferiorly.

EXAM:
ULTRASOUND OF THE LEFT BREAST

[Series 1: ultrasound left breast limited · 0.06mm/px · 4 of 4 slices shown]
[im 1/4]
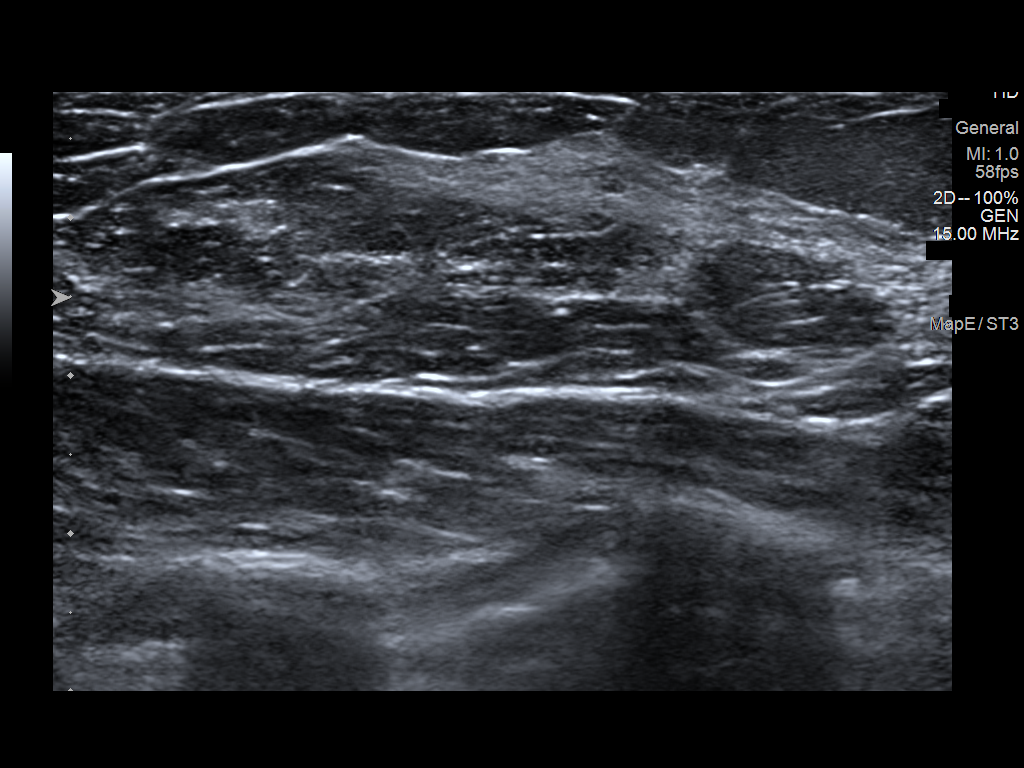
[im 2/4]
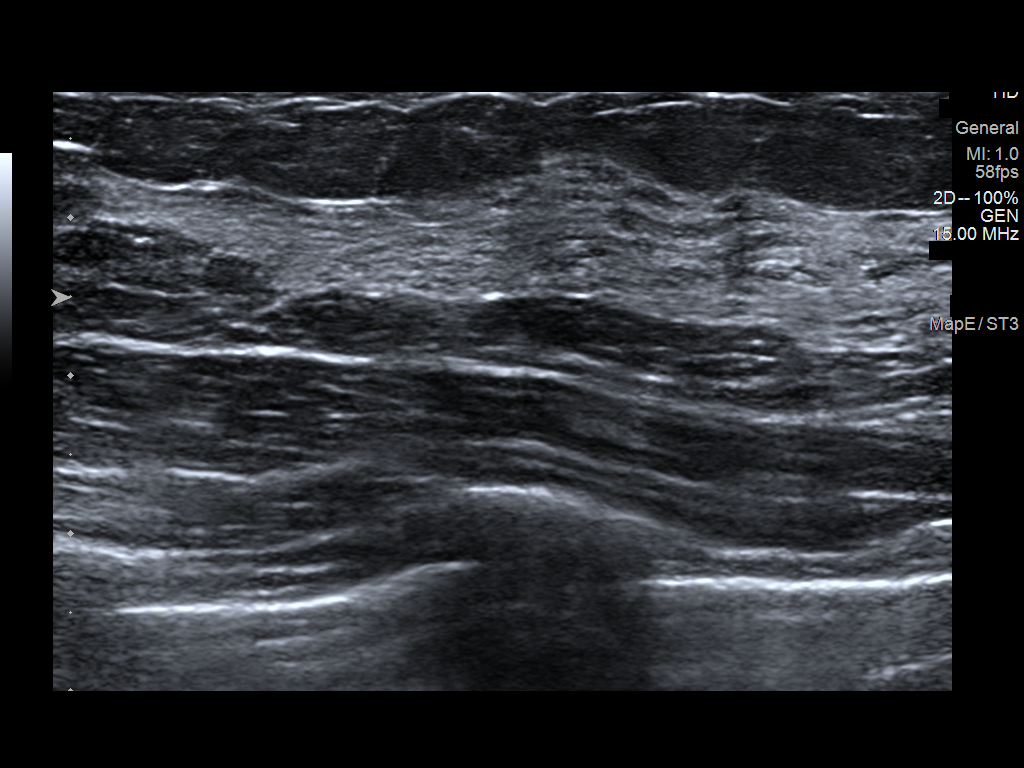
[im 3/4]
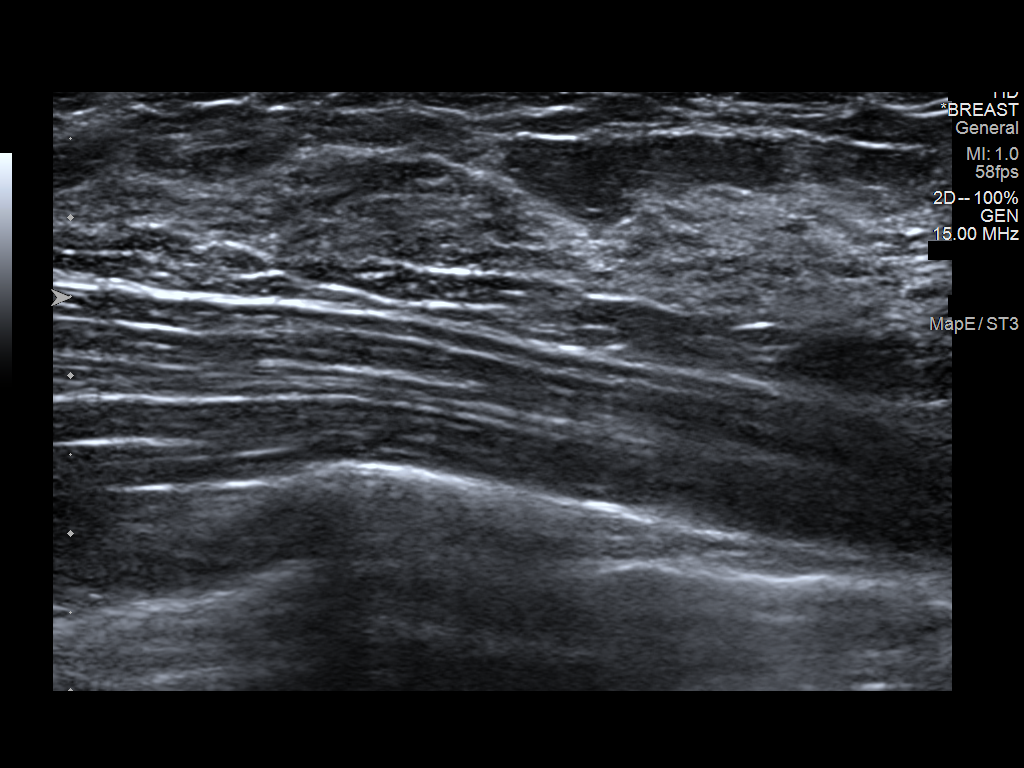
[im 4/4]
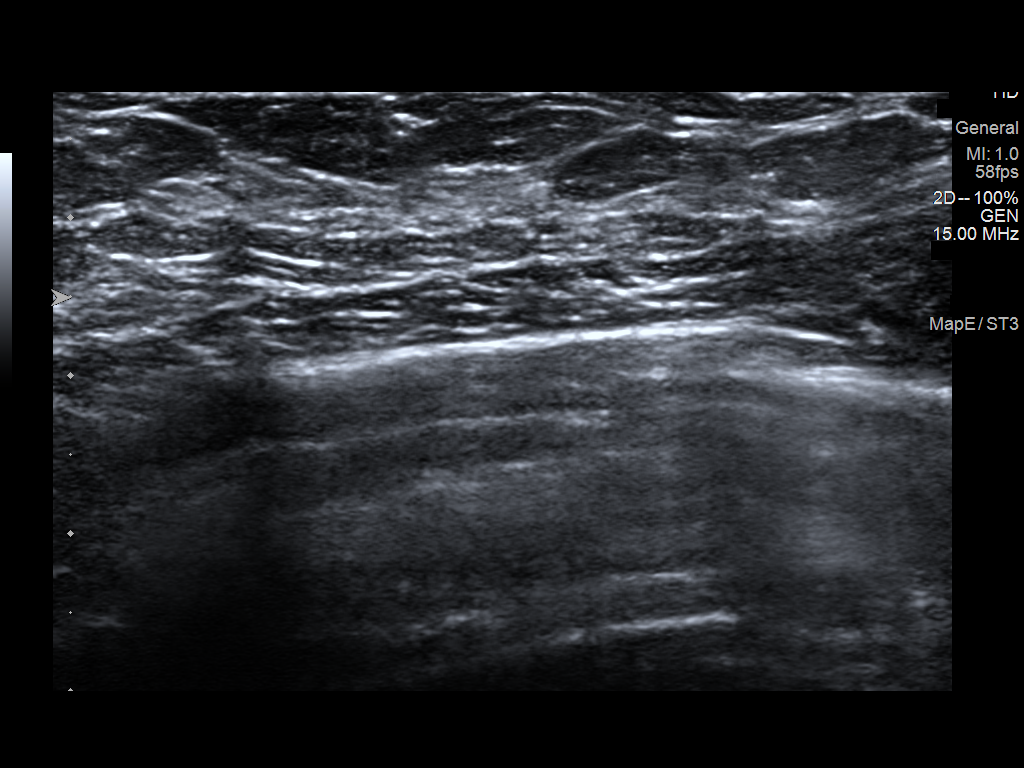

[4 of 4 positions shown; findings below may reference images not displayed]

FINDINGS: On physical exam, I palpate no mass within the superior or inferior
left breast.

Targeted ultrasound is performed, showing normal dense tissue
without suspicious mass within the 11-1 o'clock position and 6
o'clock position left breast.
IMPRESSION: No suspicious abnormality identified within the left breast.

RECOMMENDATION:
Continued clinical evaluation for left breast tenderness.

Screening mammogram at age 38 unless there are persistent or
intervening clinical concerns. (Code:[id number])

I have discussed the findings and recommendations with the patient.
Results were also provided in writing at the conclusion of the
visit. If applicable, a reminder letter will be sent to the patient
regarding the next appointment.

BI-RADS CATEGORY  1: Negative.

## 2019-08-13 ENCOUNTER — Other Ambulatory Visit: Payer: Self-pay

## 2019-08-13 ENCOUNTER — Encounter (HOSPITAL_COMMUNITY): Payer: Self-pay

## 2019-08-13 ENCOUNTER — Ambulatory Visit (HOSPITAL_COMMUNITY)
Admission: EM | Admit: 2019-08-13 | Discharge: 2019-08-13 | Disposition: A | Discharge status: Home / Self Care | Payer: 59 | Attending: Family Medicine | Admitting: Family Medicine

## 2019-08-13 DIAGNOSIS — Z886 Allergy status to analgesic agent status: Secondary | ICD-10-CM | POA: Insufficient documentation

## 2019-08-13 DIAGNOSIS — R5383 Other fatigue: Secondary | ICD-10-CM | POA: Insufficient documentation

## 2019-08-13 DIAGNOSIS — Z8249 Family history of ischemic heart disease and other diseases of the circulatory system: Secondary | ICD-10-CM | POA: Insufficient documentation

## 2019-08-13 DIAGNOSIS — Z833 Family history of diabetes mellitus: Secondary | ICD-10-CM | POA: Diagnosis not present

## 2019-08-13 DIAGNOSIS — Z20822 Contact with and (suspected) exposure to covid-19: Secondary | ICD-10-CM | POA: Insufficient documentation

## 2019-08-13 NOTE — ED Triage Notes (Signed)
Pt states she has fatigue x 3 days.

## 2019-08-13 NOTE — Discharge Instructions (Signed)
Make sure that you eat well.  Take a multivitamin if you feel like you might be lacking in nutrition.  Take a walk every day that you are able. Schedule with your primary care doctor for a checkup and lab work

## 2019-08-13 NOTE — ED Provider Notes (Signed)
[facility]    CSN: [id number] Arrival date & time: 08/13/19  1828      History   Chief Complaint Chief Complaint  Patient presents with  . Fatigue    HPI Ellen Roberts is a 24 y.o. female.   HPI  Patient is here for "severe fatigue".  Is been going on for 3 days.  She states that she went out of town.  Is been tired ever since.  Did not sleep well. In general she states she sleeps well. She thinks she might be anemic. She thinks it might be her sugar. I explained to her that he checked her blood count 3 times in the last couple of years and it was always normal I explained to her that a blood sugar will not cause her to have fatigue for 3 days I explained to her that the work-up for fatigue is beyond the scope of an urgent care center at closing time.  She needs a physical examination and blood work, recommend that she see her primary care doctor. She has no cold intolerance, weight changes, or pedal edema  Past Medical History:  Diagnosis Date  . Anemia   . No pertinent past medical history     There are no problems to display for this patient.   Past Surgical History:  Procedure Laterality Date  . HERNIA REPAIR      OB History    Gravida  0   Para      Term      Preterm      AB      Living        SAB      TAB      Ectopic      Multiple      Live Births               Home Medications    Prior to Admission medications   Medication Sig Start Date End Date Taking? Authorizing Provider  ferrous sulfate 325 (65 FE) MG tablet Take 325 mg by mouth daily with breakfast.    [provider]  Fiber POWD Take by mouth 2 (two) times daily. Reported on 07/30/2015    [provider]  ibuprofen (ADVIL) 800 MG tablet Take 1 tablet (800 mg total) by mouth every 8 (eight) hours as needed (PAIN). 05/27/19   Lurline Idol, FNP    Family History Family History  Problem Relation Age of Onset  . Diabetes Mother   .  Hypertension Mother     Social History Social History   Tobacco Use  . Smoking status: Never Smoker  . Smokeless tobacco: Never Used  Substance Use Topics  . Alcohol use: No  . Drug use: No     Allergies   Peanut-containing drug products and Aspirin   Review of Systems Review of Systems  Constitutional: Positive for fatigue. Negative for chills and fever.  HENT: Negative for congestion.   Endocrine: Negative for cold intolerance, heat intolerance and polyuria.     Physical Exam Triage Vital Signs ED Triage Vitals  Enc Vitals Group     BP 08/13/19 1912 122/79     Pulse Rate 08/13/19 1912 63     Resp 08/13/19 1912 18     Temp 08/13/19 1912 99 F (37.2 C)     Temp Source 08/13/19 1912 Oral     SpO2 08/13/19 1912 100 %     Weight 08/13/19 1910 170 lb (77.1 kg)  Height --      Head Circumference --      Peak Flow --      Pain Score 08/13/19 1910 0     Pain Loc --      Pain Edu? --      Excl. in [place]? --    No data found.  Updated Vital Signs BP 122/79 (BP Location: Right Arm)   Pulse 63   Temp 99 F (37.2 C) (Oral)   Resp 18   Wt 77.1 kg   LMP 08/11/2019   SpO2 100%   BMI 30.11 kg/m      Physical Exam Constitutional:      General: She is not in acute distress.    Appearance: She is well-developed and normal weight.  HENT:     Head: Normocephalic and atraumatic.     Mouth/Throat:     Comments: Mask in place Eyes:     Conjunctiva/sclera: Conjunctivae normal.     Pupils: Pupils are equal, round, and reactive to light.  Cardiovascular:     Rate and Rhythm: Normal rate and regular rhythm.     Heart sounds: Normal heart sounds.  Pulmonary:     Effort: Pulmonary effort is normal. No respiratory distress.     Breath sounds: Normal breath sounds.  Abdominal:     General: There is no distension.     Palpations: Abdomen is soft.  Musculoskeletal:        General: Normal range of motion.     Cervical back: Normal range of motion.     Right lower  leg: No edema.     Left lower leg: Edema present.  Skin:    General: Skin is warm and dry.  Neurological:     General: No focal deficit present.     Mental Status: She is alert.     Deep Tendon Reflexes: Reflexes abnormal.      UC Treatments / Results  Labs (all labs ordered are listed, but only abnormal results are displayed) Labs Reviewed  SARS CORONAVIRUS 2 (TAT 6-24 HRS)    EKG   Radiology No results found.  Procedures Procedures (including critical care time)  Medications Ordered in UC Medications - No data to display  Initial Impression / Assessment and Plan / UC Course  I have reviewed the triage vital signs and the nursing notes.  Pertinent labs & imaging results that were available during my care of the patient were reviewed by me and considered in my medical decision making (see chart for details).     *Patient has unilateral edema.  Left only.  She states is been like this for years. Patient does have dull reflexes.  I told her this could be hypothyroidism. Patient does admit to a lot of emotional stress.  I told her this definitely contributes to fatigue. Final Clinical Impressions(s) / UC Diagnoses   Final diagnoses:  Fatigue, unspecified type     Discharge Instructions     Make sure that you eat well.  Take a multivitamin if you feel like you might be lacking in nutrition.  Take a walk every day that you are able. Schedule with your primary care doctor for a checkup and lab work   ED Prescriptions    None     PDMP not reviewed this encounter.   Raylene Everts, MD 08/13/19 2045

## 2019-08-14 LAB — SARS CORONAVIRUS 2 (TAT 6-24 HRS): SARS Coronavirus 2: NEGATIVE

## 2019-08-19 ENCOUNTER — Other Ambulatory Visit (HOSPITAL_COMMUNITY)
Admission: RE | Admit: 2019-08-19 | Discharge: 2019-08-19 | Disposition: A | Discharge status: Home / Self Care | Payer: 59 | Source: Ambulatory Visit | Attending: Registered Nurse | Admitting: Registered Nurse

## 2019-08-19 ENCOUNTER — Other Ambulatory Visit: Payer: Self-pay

## 2019-08-19 ENCOUNTER — Encounter: Payer: Self-pay | Admitting: Registered Nurse

## 2019-08-19 ENCOUNTER — Ambulatory Visit (INDEPENDENT_AMBULATORY_CARE_PROVIDER_SITE_OTHER): Payer: 59 | Admitting: Registered Nurse

## 2019-08-19 VITALS — BP 108/71 | HR 80 | Temp 98.0°F | Resp 16 | Ht 63.0 in | Wt 168.8 lb

## 2019-08-19 DIAGNOSIS — Z113 Encounter for screening for infections with a predominantly sexual mode of transmission: Secondary | ICD-10-CM

## 2019-08-19 DIAGNOSIS — R310 Gross hematuria: Secondary | ICD-10-CM

## 2019-08-19 DIAGNOSIS — N3001 Acute cystitis with hematuria: Secondary | ICD-10-CM

## 2019-08-19 LAB — POCT URINALYSIS DIP (MANUAL ENTRY)
Bilirubin, UA: NEGATIVE
Blood, UA: NEGATIVE
Glucose, UA: NEGATIVE mg/dL
Ketones, POC UA: NEGATIVE mg/dL
Nitrite, UA: NEGATIVE
Protein Ur, POC: NEGATIVE mg/dL
Spec Grav, UA: 1.025 (ref 1.010–1.025)
Urobilinogen, UA: 0.2 E.U./dL
pH, UA: 7.5 (ref 5.0–8.0)

## 2019-08-19 MED ORDER — SULFAMETHOXAZOLE-TRIMETHOPRIM 800-160 MG PO TABS: 1.0000 | ORAL_TABLET | Freq: Two times a day (BID) | ORAL | 0 refills | Status: DC

## 2019-08-19 NOTE — Patient Instructions (Signed)
° ° ° °  If you have lab work done today you will be contacted with your lab results within the next 2 weeks.  If you have not heard from us then please contact us. The fastest way to get your results is to register for My Chart. ° ° °IF you received an x-ray today, you will receive an invoice from [facility]. Please contact [facility] at [phone] with questions or concerns regarding your invoice.  ° °IF you received labwork today, you will receive an invoice from [facility]. Please contact [facility] at [phone] with questions or concerns regarding your invoice.  ° °Our billing staff will not be able to assist you with questions regarding bills from these companies. ° °You will be contacted with the lab results as soon as they are available. The fastest way to get your results is to activate your My Chart account. Instructions are located on the last page of this paperwork. If you have not heard from us regarding the results in 2 weeks, please contact this office. °  ° ° ° °

## 2019-08-19 NOTE — Progress Notes (Signed)
Acute Office Visit  Subjective:    Patient ID: Gratia Disla, female    DOB: [birth year], 24 y.o.   MRN: [id number]  Chief Complaint  Patient presents with  . Hematuria    patient states for the past couple of weeks sha has been noticing blood in her urine and today is was alot , but not on her menstrual cycle. Per patient she has some discomfort when she urinate No odor or discharge.    HPI Patient is in today for gross hematuria  1-2 instances last week, today was most notable. Some dysuria and suprapubic discomfort Has not had hx of uti Denies risk for STI but requests testing No flank pain, fever, chills, systemic symptoms of infection  No further concerns.  Past Medical History:  Diagnosis Date  . Anemia   . No pertinent past medical history     Past Surgical History:  Procedure Laterality Date  . HERNIA REPAIR      Family History  Problem Relation Age of Onset  . Diabetes Mother   . Hypertension Mother     Social History   Socioeconomic History  . Marital status: Single    Spouse name: Not on file  . Number of children: Not on file  . Years of education: Not on file  . Highest education level: Not on file  Occupational History  . Not on file  Tobacco Use  . Smoking status: Never Smoker  . Smokeless tobacco: Never Used  Substance and Sexual Activity  . Alcohol use: No  . Drug use: No  . Sexual activity: Never  Other Topics Concern  . Not on file  Social History Narrative  . Not on file   Social Determinants of Health   Financial Resource Strain:   . Difficulty of Paying Living Expenses:   Food Insecurity:   . Worried About Charity fundraiser in the Last Year:   . Arboriculturist in the Last Year:   Transportation Needs:   . Film/video editor (Medical):   Marland Kitchen Lack of Transportation (Non-Medical):   Physical Activity:   . Days of Exercise per Week:   . Minutes of Exercise per Session:   Stress:   . Feeling of Stress :   Social  Connections:   . Frequency of Communication with Friends and Family:   . Frequency of Social Gatherings with Friends and Family:   . Attends Religious Services:   . Active Member of Clubs or Organizations:   . Attends Archivist Meetings:   Marland Kitchen Marital Status:   Intimate Partner Violence:   . Fear of Current or Ex-Partner:   . Emotionally Abused:   Marland Kitchen Physically Abused:   . Sexually Abused:     Outpatient Medications Prior to Visit  Medication Sig Dispense Refill  . ferrous sulfate 325 (65 FE) MG tablet Take 325 mg by mouth daily with breakfast.    . Fiber POWD Take by mouth 2 (two) times daily. Reported on 07/30/2015    . ibuprofen (ADVIL) 800 MG tablet Take 1 tablet (800 mg total) by mouth every 8 (eight) hours as needed (PAIN). 21 tablet 0   No facility-administered medications prior to visit.    Allergies  Allergen Reactions  . Peanut-Containing Drug Products     Ance   . Aspirin Itching and Rash    Stomach pains    Review of Systems  Constitutional: Negative.   HENT: Negative.   Eyes: Negative.  Respiratory: Negative.   Cardiovascular: Negative.   Gastrointestinal: Negative.   Endocrine: Negative.   Genitourinary: Positive for dysuria and pelvic pain. Negative for decreased urine volume, difficulty urinating, dyspareunia, flank pain, frequency, genital sores, hematuria, menstrual problem, urgency, vaginal bleeding, vaginal discharge and vaginal pain.  Musculoskeletal: Negative.   Skin: Negative.   Allergic/Immunologic: Negative.   Neurological: Negative.   Hematological: Negative.   Psychiatric/Behavioral: Negative.   All other systems reviewed and are negative.      Objective:    Physical Exam Vitals and nursing note reviewed.  Constitutional:      General: She is not in acute distress.    Appearance: Normal appearance. She is normal weight. She is not ill-appearing, toxic-appearing or diaphoretic.  Skin:    General: Skin is warm and dry.      Capillary Refill: Capillary refill takes less than 2 seconds.     Coloration: Skin is not jaundiced or pale.     Findings: No bruising, erythema, lesion or rash.  Neurological:     General: No focal deficit present.     Mental Status: She is alert and oriented to person, place, and time. Mental status is at baseline.  Psychiatric:        Mood and Affect: Mood normal.        Behavior: Behavior normal.        Thought Content: Thought content normal.        Judgment: Judgment normal.     BP 108/71   Pulse 80   Temp 98 F (36.7 C) (Temporal)   Resp 16   Ht 5\' 3"  (1.6 m)   Wt 168 lb 12.8 oz (76.6 kg)   LMP 08/11/2019   SpO2 99%   BMI 29.90 kg/m  Wt Readings from Last 3 Encounters:  08/19/19 168 lb 12.8 oz (76.6 kg)  08/13/19 170 lb (77.1 kg)  05/22/18 165 lb 12.8 oz (75.2 kg)    Health Maintenance Due  Topic Date Due  . HIV Screening  Never done    There are no preventive care reminders to display for this patient.   Lab Results  Component Value Date   TSH 1.627 01/16/2018   Lab Results  Component Value Date   WBC 3.0 (L) 05/11/2019   HGB 12.6 05/11/2019   HCT 39.2 05/11/2019   MCV 80.0 05/11/2019   PLT 212 05/11/2019   Lab Results  Component Value Date   NA 143 04/30/2018   K 3.9 04/30/2018   CO2 19 (L) 04/30/2018   GLUCOSE 78 04/30/2018   BUN 7 04/30/2018   CREATININE 0.95 04/30/2018   BILITOT 0.6 01/16/2018   ALKPHOS 41 01/16/2018   AST 20 01/16/2018   ALT 14 01/16/2018   PROT 6.9 01/16/2018   ALBUMIN 3.7 01/16/2018   CALCIUM 10.1 04/30/2018   ANIONGAP 9 01/16/2018   No results found for: CHOL No results found for: HDL No results found for: LDLCALC No results found for: TRIG No results found for: CHOLHDL Lab Results  Component Value Date   HGBA1C 5.3 09/23/2015       Assessment & Plan:   Problem List Items Addressed This Visit    None    Visit Diagnoses    Gross hematuria    -  Primary   Relevant Orders   POCT urinalysis dipstick  (Completed)   Urine Culture   Screen for STD (sexually transmitted disease)       Relevant Orders   HIV antibody (with reflex)  RPR   Urine cytology ancillary only       No orders of the defined types were placed in this encounter.  PLAN  POCT UA shows likely UTI  Bactrim po bid for 3 days  Call clinic if symptoms dont resolve  Std screen and urine culture sent  Patient encouraged to call clinic with any questions, comments, or concerns.   Janeece Agee, NP

## 2019-08-20 LAB — RPR: RPR Ser Ql: NONREACTIVE

## 2019-08-20 LAB — HIV ANTIBODY (ROUTINE TESTING W REFLEX): HIV Screen 4th Generation wRfx: NONREACTIVE

## 2019-08-21 LAB — URINE CYTOLOGY ANCILLARY ONLY
Chlamydia: POSITIVE — AB
Comment: NEGATIVE
Comment: NEGATIVE
Comment: NORMAL
Neisseria Gonorrhea: NEGATIVE
Trichomonas: NEGATIVE

## 2019-08-21 LAB — URINE CULTURE

## 2019-08-26 ENCOUNTER — Other Ambulatory Visit: Payer: Self-pay | Admitting: Registered Nurse

## 2019-08-26 ENCOUNTER — Telehealth: Payer: Self-pay | Admitting: Registered Nurse

## 2019-08-26 DIAGNOSIS — A749 Chlamydial infection, unspecified: Secondary | ICD-10-CM

## 2019-08-26 MED ORDER — AZITHROMYCIN 250 MG PO TABS: 1000.0000 mg | ORAL_TABLET | Freq: Once | ORAL | 0 refills | Status: AC

## 2019-08-26 NOTE — Telephone Encounter (Signed)
Called patient  -  Results show pos for CT infx  Azithromycin 1000g PO once Abstain from sexual activity for one week Partners need to seek treatment  Pt understands  Jari Sportsman, NP

## 2019-10-30 ENCOUNTER — Encounter: Payer: Self-pay | Admitting: Registered Nurse

## 2019-10-30 ENCOUNTER — Other Ambulatory Visit (HOSPITAL_COMMUNITY)
Admission: RE | Admit: 2019-10-30 | Discharge: 2019-10-30 | Disposition: A | Discharge status: Home / Self Care | Payer: 59 | Source: Ambulatory Visit | Attending: Registered Nurse | Admitting: Registered Nurse

## 2019-10-30 ENCOUNTER — Ambulatory Visit (INDEPENDENT_AMBULATORY_CARE_PROVIDER_SITE_OTHER): Payer: 59 | Admitting: Registered Nurse

## 2019-10-30 ENCOUNTER — Other Ambulatory Visit: Payer: Self-pay

## 2019-10-30 VITALS — BP 114/78 | HR 73 | Temp 98.0°F | Resp 18 | Ht 63.0 in | Wt 174.2 lb

## 2019-10-30 DIAGNOSIS — R609 Edema, unspecified: Secondary | ICD-10-CM

## 2019-10-30 DIAGNOSIS — Z113 Encounter for screening for infections with a predominantly sexual mode of transmission: Secondary | ICD-10-CM | POA: Insufficient documentation

## 2019-10-30 DIAGNOSIS — Z23 Encounter for immunization: Secondary | ICD-10-CM

## 2019-10-30 LAB — POCT URINE PREGNANCY: Preg Test, Ur: NEGATIVE

## 2019-10-30 NOTE — Patient Instructions (Signed)
° ° ° °  If you have lab work done today you will be contacted with your lab results within the next 2 weeks.  If you have not heard from us then please contact us. The fastest way to get your results is to register for My Chart. ° ° °IF you received an x-ray today, you will receive an invoice from [facility]. Please contact [facility] at [phone] with questions or concerns regarding your invoice.  ° °IF you received labwork today, you will receive an invoice from [facility]. Please contact [facility] at [phone] with questions or concerns regarding your invoice.  ° °Our billing staff will not be able to assist you with questions regarding bills from these companies. ° °You will be contacted with the lab results as soon as they are available. The fastest way to get your results is to activate your My Chart account. Instructions are located on the last page of this paperwork. If you have not heard from us regarding the results in 2 weeks, please contact this office. °  ° ° ° °

## 2019-10-31 LAB — COMPREHENSIVE METABOLIC PANEL
ALT: 15 IU/L (ref 0–32)
AST: 21 IU/L (ref 0–40)
Albumin/Globulin Ratio: 1.6 (ref 1.2–2.2)
Albumin: 4.5 g/dL (ref 3.9–5.0)
Alkaline Phosphatase: 48 IU/L (ref 48–121)
BUN/Creatinine Ratio: 17 (ref 9–23)
BUN: 14 mg/dL (ref 6–20)
Bilirubin Total: <0.2 mg/dL (ref 0.0–1.2)
CO2: 23 mmol/L (ref 20–29)
Calcium: 9.9 mg/dL (ref 8.7–10.2)
Chloride: 103 mmol/L (ref 96–106)
Creatinine, Ser: 0.82 mg/dL (ref 0.57–1.00)
GFR calc Af Amer: 114 mL/min/{1.73_m2} (ref >59)
GFR calc non Af Amer: 99 mL/min/{1.73_m2} (ref >59)
Globulin, Total: 2.9 g/dL (ref 1.5–4.5)
Glucose: 82 mg/dL (ref 65–99)
Potassium: 4.2 mmol/L (ref 3.5–5.2)
Sodium: 139 mmol/L (ref 134–144)
Total Protein: 7.4 g/dL (ref 6.0–8.5)

## 2019-10-31 LAB — CBC WITH DIFFERENTIAL/PLATELET
Basophils Absolute: 0 10*3/uL (ref 0.0–0.2)
Basos: 0 %
EOS (ABSOLUTE): 0 10*3/uL (ref 0.0–0.4)
Eos: 1 %
Hematocrit: 39.7 % (ref 34.0–46.6)
Hemoglobin: 12.2 g/dL (ref 11.1–15.9)
Immature Grans (Abs): 0 10*3/uL (ref 0.0–0.1)
Immature Granulocytes: 0 %
Lymphocytes Absolute: 1.5 10*3/uL (ref 0.7–3.1)
Lymphs: 38 %
MCH: 25.8 pg — ABNORMAL LOW (ref 26.6–33.0)
MCHC: 30.7 g/dL — ABNORMAL LOW (ref 31.5–35.7)
MCV: 84 fL (ref 79–97)
Monocytes Absolute: 0.3 10*3/uL (ref 0.1–0.9)
Monocytes: 7 %
Neutrophils Absolute: 2.1 10*3/uL (ref 1.4–7.0)
Neutrophils: 54 %
Platelets: 225 10*3/uL (ref 150–450)
RBC: 4.72 x10E6/uL (ref 3.77–5.28)
RDW: 13.1 % (ref 11.7–15.4)
WBC: 3.9 10*3/uL (ref 3.4–10.8)

## 2019-10-31 LAB — URINALYSIS
Bilirubin, UA: NEGATIVE
Glucose, UA: NEGATIVE
Ketones, UA: NEGATIVE
Leukocytes,UA: NEGATIVE
Nitrite, UA: NEGATIVE
Protein,UA: NEGATIVE
RBC, UA: NEGATIVE
Specific Gravity, UA: 1.016 (ref 1.005–1.030)
Urobilinogen, Ur: 0.2 mg/dL (ref 0.2–1.0)
pH, UA: 5.5 (ref 5.0–7.5)

## 2019-10-31 LAB — SEDIMENTATION RATE: Sed Rate: 10 mm/hr (ref 0–32)

## 2019-10-31 LAB — HIV ANTIBODY (ROUTINE TESTING W REFLEX): HIV Screen 4th Generation wRfx: NONREACTIVE

## 2019-10-31 LAB — RPR: RPR Ser Ql: NONREACTIVE

## 2019-10-31 LAB — C-REACTIVE PROTEIN: CRP: <1 mg/L (ref 0–10)

## 2019-10-31 LAB — TSH: TSH: 2.17 u[IU]/mL (ref 0.450–4.500)

## 2019-11-01 LAB — URINE CYTOLOGY ANCILLARY ONLY
Chlamydia: NEGATIVE
Comment: NEGATIVE
Comment: NEGATIVE
Comment: NORMAL
Neisseria Gonorrhea: NEGATIVE
Trichomonas: NEGATIVE

## 2020-01-26 ENCOUNTER — Encounter: Payer: Self-pay | Admitting: Registered Nurse

## 2020-01-26 NOTE — Progress Notes (Signed)
Acute Office Visit  Subjective:    Patient ID: Ellen Roberts, female    DOB: [birth year], 24 y.o.   MRN: [id number]  Chief Complaint  Patient presents with  . Foot Swelling    Patient states that since 2013 she has been getting swelling in her left foot and had all kinds of exams and test and states nothing is wrong. Per patient she is concerned more now because now both feet are swelling and painful. She states when she wake up she notice that they have went down but by the end of the day its swollen    HPI Patient is in today for foot swelling L > R.  Ongoing on and off since 2013.  Has been through a variety of tests with no clear clinical dx.  It is not painful It is worst at end of day Relieved with compression and elevation  Notes dysuria. Mild. Intermittent. No frequency or urgency. No flank pain or fever  Requesting Std screening. No symptoms. No known exposure.   Past Medical History:  Diagnosis Date  . Anemia   . No pertinent past medical history     Past Surgical History:  Procedure Laterality Date  . HERNIA REPAIR      Family History  Problem Relation Age of Onset  . Diabetes Mother   . Hypertension Mother     Social History   Socioeconomic History  . Marital status: Single    Spouse name: Not on file  . Number of children: Not on file  . Years of education: Not on file  . Highest education level: Not on file  Occupational History  . Not on file  Tobacco Use  . Smoking status: Never Smoker  . Smokeless tobacco: Never Used  Substance and Sexual Activity  . Alcohol use: No  . Drug use: No  . Sexual activity: Never  Other Topics Concern  . Not on file  Social History Narrative  . Not on file   Social Determinants of Health   Financial Resource Strain:   . Difficulty of Paying Living Expenses: Not on file  Food Insecurity:   . Worried About Programme researcher, broadcasting/film/video in the Last Year: Not on file  . Ran Out of Food in the Last Year: Not on file    Transportation Needs:   . Lack of Transportation (Medical): Not on file  . Lack of Transportation (Non-Medical): Not on file  Physical Activity:   . Days of Exercise per Week: Not on file  . Minutes of Exercise per Session: Not on file  Stress:   . Feeling of Stress : Not on file  Social Connections:   . Frequency of Communication with Friends and Family: Not on file  . Frequency of Social Gatherings with Friends and Family: Not on file  . Attends Religious Services: Not on file  . Active Member of Clubs or Organizations: Not on file  . Attends Banker Meetings: Not on file  . Marital Status: Not on file  Intimate Partner Violence:   . Fear of Current or Ex-Partner: Not on file  . Emotionally Abused: Not on file  . Physically Abused: Not on file  . Sexually Abused: Not on file    Outpatient Medications Prior to Visit  Medication Sig Dispense Refill  . ferrous sulfate 325 (65 FE) MG tablet Take 325 mg by mouth daily with breakfast.    . Fiber POWD Take by mouth 2 (two) times daily. Reported  on 07/30/2015    . ibuprofen (ADVIL) 800 MG tablet Take 1 tablet (800 mg total) by mouth every 8 (eight) hours as needed (PAIN). 21 tablet 0  . sulfamethoxazole-trimethoprim (BACTRIM DS) 800-160 MG tablet Take 1 tablet by mouth 2 (two) times daily. (Patient not taking: Reported on 10/30/2019) 6 tablet 0   No facility-administered medications prior to visit.    Allergies  Allergen Reactions  . Peanut-Containing Drug Products     Ance   . Aspirin Itching and Rash    Stomach pains    Review of Systems  Constitutional: Negative.   HENT: Negative.   Eyes: Negative.   Respiratory: Negative.   Cardiovascular: Negative.   Gastrointestinal: Negative.   Genitourinary: Negative.   Musculoskeletal: Negative.   Skin: Negative.   Neurological: Negative.   Psychiatric/Behavioral: Negative.        Objective:    Physical Exam Vitals and nursing note reviewed.  Constitutional:       Appearance: Normal appearance.  Cardiovascular:     Rate and Rhythm: Normal rate and regular rhythm.  Musculoskeletal:     Right lower leg: No edema.     Left lower leg: Edema (+1) present.  Skin:    General: Skin is warm and dry.     Capillary Refill: Capillary refill takes less than 2 seconds.     Coloration: Skin is not jaundiced or pale.     Findings: No bruising, erythema, lesion or rash.  Neurological:     General: No focal deficit present.     Mental Status: She is alert and oriented to person, place, and time. Mental status is at baseline.  Psychiatric:        Mood and Affect: Mood normal.        Behavior: Behavior normal.        Thought Content: Thought content normal.        Judgment: Judgment normal.     BP 114/78   Pulse 73   Temp 98 F (36.7 C) (Temporal)   Resp 18   Ht 5\' 3"  (1.6 m)   Wt 174 lb 3.2 oz (79 kg)   SpO2 99%   BMI 30.86 kg/m  Wt Readings from Last 3 Encounters:  10/30/19 174 lb 3.2 oz (79 kg)  08/19/19 168 lb 12.8 oz (76.6 kg)  08/13/19 170 lb (77.1 kg)    Health Maintenance Due  Topic Date Due  . Hepatitis C Screening  Never done  . COVID-19 Vaccine (1) Never done  . PAP SMEAR-Modifier  Never done  . INFLUENZA VACCINE  12/01/2019    There are no preventive care reminders to display for this patient.   Lab Results  Component Value Date   TSH 2.170 10/30/2019   Lab Results  Component Value Date   WBC 3.9 10/30/2019   HGB 12.2 10/30/2019   HCT 39.7 10/30/2019   MCV 84 10/30/2019   PLT 225 10/30/2019   Lab Results  Component Value Date   NA 139 10/30/2019   K 4.2 10/30/2019   CO2 23 10/30/2019   GLUCOSE 82 10/30/2019   BUN 14 10/30/2019   CREATININE 0.82 10/30/2019   BILITOT <0.2 10/30/2019   ALKPHOS 48 10/30/2019   AST 21 10/30/2019   ALT 15 10/30/2019   PROT 7.4 10/30/2019   ALBUMIN 4.5 10/30/2019   CALCIUM 9.9 10/30/2019   ANIONGAP 9 01/16/2018   No results found for: CHOL No results found for: HDL No  results found for: LDLCALC No  results found for: TRIG No results found for: CHOLHDL Lab Results  Component Value Date   HGBA1C 5.3 09/23/2015       Assessment & Plan:   Problem List Items Addressed This Visit    None    Visit Diagnoses    Screening for STD (sexually transmitted disease)    -  Primary   Relevant Orders   HIV antibody (with reflex) (Completed)   Urine cytology ancillary only (Completed)   RPR (Completed)   Dependent edema       Relevant Orders   Sedimentation Rate (Completed)   C-reactive protein (Completed)   Comprehensive metabolic panel (Completed)   CBC with Differential (Completed)   TSH (Completed)   Urinalysis (Completed)   POCT urine pregnancy (Completed)   Need for diphtheria-tetanus-pertussis (Tdap) vaccine       Relevant Orders   Tdap vaccine greater than or equal to 7yo IM (Completed)       No orders of the defined types were placed in this encounter.   PLAN  Do not feel this is DVT - do not feel patient is high risk  Likely some peripheral vascular concern - discuss that we could refer to vascular but if pain is not a factor and symptoms are not worsening, no urgent concern  POCT UA not of concern for UTI  STD screen collected, will follow up as warranted  Patient encouraged to call clinic with any questions, comments, or concerns.  Janeece Agee, NP

## 2020-06-25 IMAGING — DX DG CHEST 2V
2 series · 2 of 2 positions shown · non-contrast
Comparison: May 01, 2017

CLINICAL DATA: Left-sided chest pain.  Cough.  Suspected COVID 19.

EXAM:
CHEST - 2 VIEW

[chest pa]
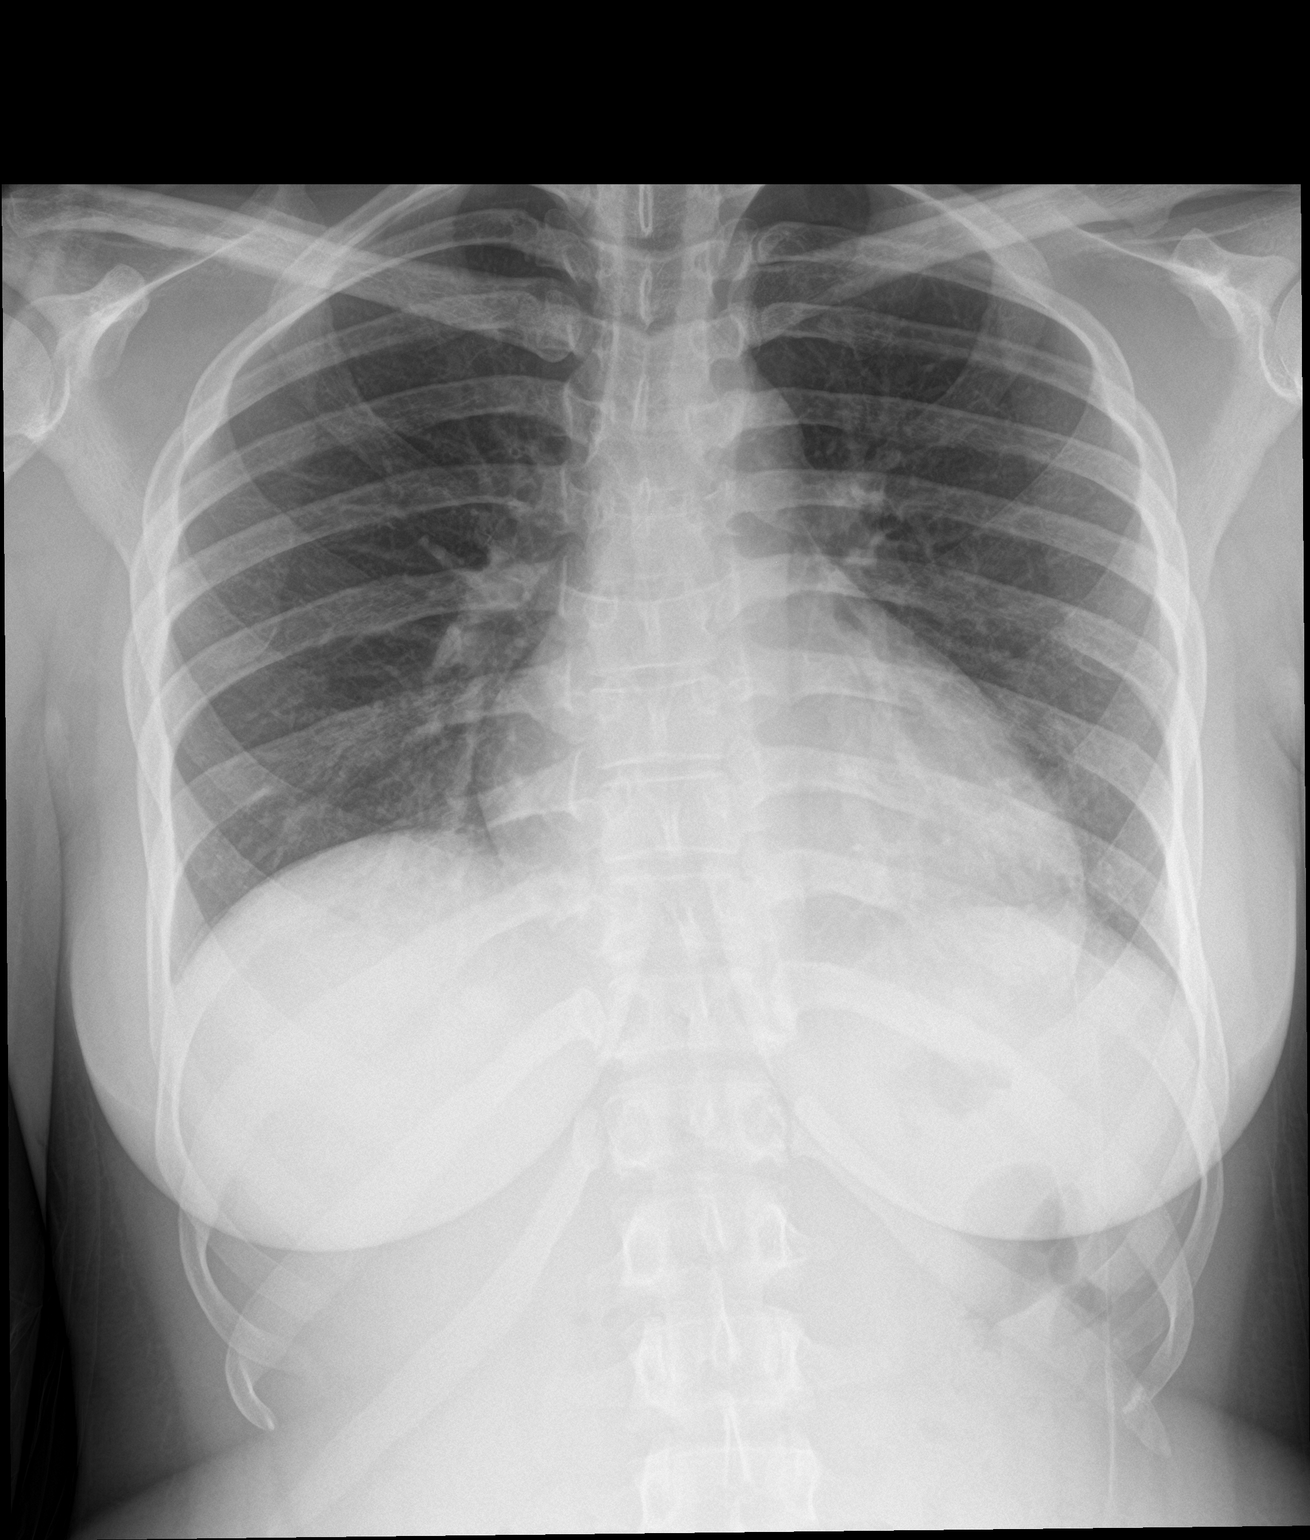

[chest lat]
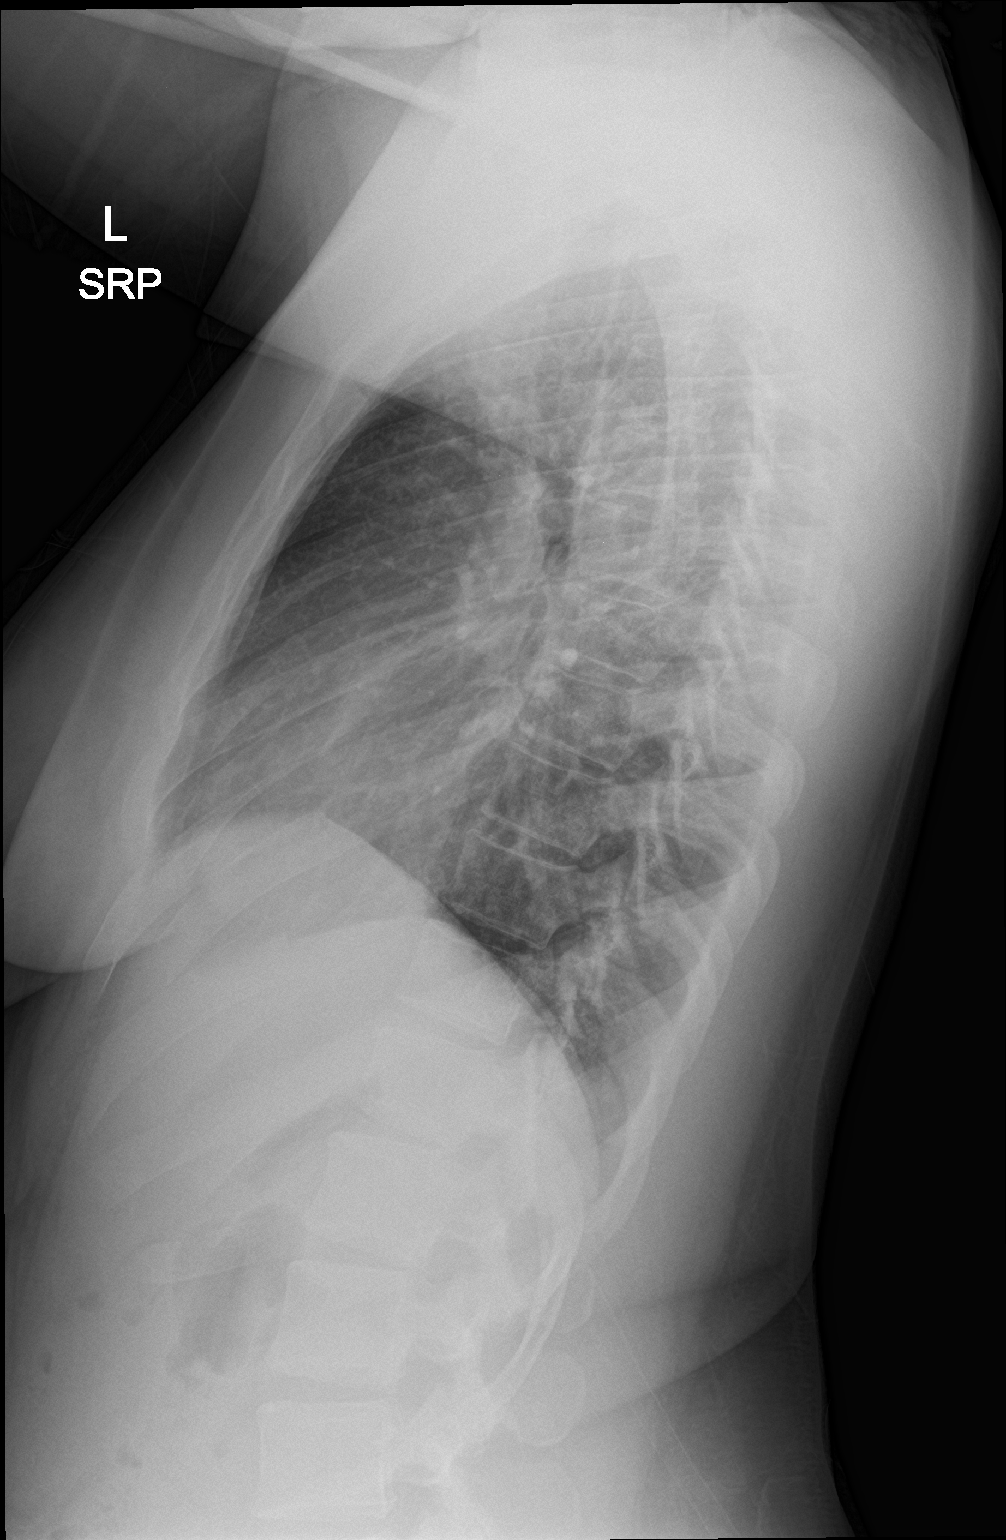

[2 of 2 positions shown; findings below may reference images not displayed]

FINDINGS: The heart size and mediastinal contours are within normal limits.
Both lungs are clear. The visualized skeletal structures are
unremarkable.
IMPRESSION: No active cardiopulmonary disease.

## 2021-05-01 ENCOUNTER — Encounter (HOSPITAL_COMMUNITY): Payer: Self-pay | Admitting: Emergency Medicine

## 2021-05-01 ENCOUNTER — Other Ambulatory Visit: Payer: Self-pay

## 2021-05-01 ENCOUNTER — Emergency Department (HOSPITAL_COMMUNITY)
Admission: EM | Admit: 2021-05-01 | Discharge: 2021-05-02 | Discharge status: Against Medical Advice | Payer: 59 | Attending: Emergency Medicine | Admitting: Emergency Medicine

## 2021-05-01 DIAGNOSIS — H5712 Ocular pain, left eye: Secondary | ICD-10-CM | POA: Diagnosis not present

## 2021-05-01 DIAGNOSIS — Z5321 Procedure and treatment not carried out due to patient leaving prior to being seen by health care provider: Secondary | ICD-10-CM | POA: Diagnosis not present

## 2021-05-01 MED ORDER — FLUORESCEIN SODIUM 1 MG OP STRP: 1.0000 | ORAL_STRIP | Freq: Once | OPHTHALMIC | Status: DC

## 2021-05-01 MED ORDER — TETRACAINE HCL 0.5 % OP SOLN: 2.0000 [drp] | Freq: Once | OPHTHALMIC | Status: DC

## 2021-05-01 NOTE — ED Triage Notes (Signed)
Pt c/o left eye pain and redness. States she thinks a contact lens is stuck in her eye.

## 2021-05-01 NOTE — ED Provider Notes (Signed)
Emergency Medicine Provider Triage Evaluation Note  Ellen Roberts , a 25 y.o. female  was evaluated in triage.  Pt complains of concern for contact lens stuck in her left eye.  Patient wears prescription lenses, placed her lens in her eye tonight and then felt like something was wrong with her lens, has been unable to remove it since that time.  Review of Systems  Positive: Foreign body left eye Negative: Visual disturbance  Physical Exam  There were no vitals taken for this visit. Gen:   Awake, no distress   Resp:  Normal effort  MSK:   Moves extremities without difficulty  Other:  Small subconjunctival hemorrhage at the 6 o'clock position.  No contact lens visible.  Upper lid everted, no lens present  Medical Decision Making  Medically screening exam initiated at 7:15 PM.  Appropriate orders placed.  Ellen Roberts was informed that the remainder of the evaluation will be completed by another provider, this initial triage assessment does not replace that evaluation, and the importance of remaining in the ED until their evaluation is complete.  Unable to visualize lens in triage, will order eye cart for bedside exam in dept.    Ellen Fend, PA-C 05/01/21 [id number]    Ellen Dibbles, MD 05/02/21 1104

## 2021-05-01 NOTE — ED Notes (Signed)
Pt left. 

## 2021-10-11 ENCOUNTER — Telehealth: Payer: 59 | Admitting: Physician Assistant

## 2021-10-11 DIAGNOSIS — K2900 Acute gastritis without bleeding: Secondary | ICD-10-CM

## 2021-10-11 MED ORDER — SUCRALFATE 1 G PO TABS: 1.0000 g | ORAL_TABLET | Freq: Three times a day (TID) | ORAL | 0 refills | Status: DC

## 2021-10-11 NOTE — Patient Instructions (Signed)
Ellen Roberts, thank you for joining Margaretann Loveless, PA-C for today's virtual visit.  While this provider is not your primary care provider (PCP), if your PCP is located in our provider database this encounter information will be shared with them immediately following your visit.  Consent: (Patient) Ellen Roberts provided verbal consent for this virtual visit at the beginning of the encounter.  Current Medications:  Current Outpatient Medications:    sucralfate (CARAFATE) 1 g tablet, Take 1 tablet (1 g total) by mouth 4 (four) times daily -  with meals and at bedtime for 14 days., Disp: 56 tablet, Rfl: 0   ferrous sulfate 325 (65 FE) MG tablet, Take 325 mg by mouth daily with breakfast., Disp: , Rfl:    Fiber POWD, Take by mouth 2 (two) times daily. Reported on 07/30/2015, Disp: , Rfl:    ibuprofen (ADVIL) 800 MG tablet, Take 1 tablet (800 mg total) by mouth every 8 (eight) hours as needed (PAIN)., Disp: 21 tablet, Rfl: 0   Medications ordered in this encounter:  Meds ordered this encounter  Medications   sucralfate (CARAFATE) 1 g tablet    Sig: Take 1 tablet (1 g total) by mouth 4 (four) times daily -  with meals and at bedtime for 14 days.    Dispense:  56 tablet    Refill:  0    Order Specific Question:   Supervising Provider    Answer:   Hyacinth Meeker, BRIAN [[id number]]     *If you need refills on other medications prior to your next appointment, please contact your pharmacy*  Follow-Up: Call back or seek an in-person evaluation if the symptoms worsen or if the condition fails to improve as anticipated.  Other Instructions Food Choices for Gastroesophageal Reflux Disease, Adult When you have gastroesophageal reflux disease (GERD), the foods you eat and your eating habits are very important. Choosing the right foods can help ease your discomfort. Think about working with a food expert (dietitian) to help you make good choices. What are tips for following this plan? Reading food  labels Look for foods that are low in saturated fat. Foods that may help with your symptoms include: Foods that have less than 5% of daily value (DV) of fat. Foods that have 0 grams of trans fat. Cooking Do not fry your food. Cook your food by baking, steaming, grilling, or broiling. These are all methods that do not need a lot of fat for cooking. To add flavor, try to use herbs that are low in spice and acidity. Meal planning  Choose healthy foods that are low in fat, such as: Fruits and vegetables. Whole grains. Low-fat dairy products. Lean meats, fish, and poultry. Eat small meals often instead of eating 3 large meals each day. Eat your meals slowly in a place where you are relaxed. Avoid bending over or lying down until 2-3 hours after eating. Limit high-fat foods such as fatty meats or fried foods. Limit your intake of fatty foods, such as oils, butter, and shortening. Avoid the following as told by your doctor: Foods that cause symptoms. These may be different for different people. Keep a food diary to keep track of foods that cause symptoms. Alcohol. Drinking a lot of liquid with meals. Eating meals during the 2-3 hours before bed. Lifestyle Stay at a healthy weight. Ask your doctor what weight is healthy for you. If you need to lose weight, work with your doctor to do so safely. Exercise for at least 30 minutes on  5 or more days each week, or as told by your doctor. Wear loose-fitting clothes. Do not smoke or use any products that contain nicotine or tobacco. If you need help quitting, ask your doctor. Sleep with the head of your bed higher than your feet. Use a wedge under the mattress or blocks under the bed frame to raise the head of the bed. Chew sugar-free gum after meals. What foods should eat?  Eat a healthy, well-balanced diet of fruits, vegetables, whole grains, low-fat dairy products, lean meats, fish, and poultry. Each person is different. Foods that may cause  symptoms in one person may not cause any symptoms in another person. Work with your doctor to find foods that are safe for you. The items listed above may not be a complete list of what you can eat and drink. Contact a food expert for more options. What foods should I avoid? Limiting some of these foods may help in managing the symptoms of GERD. Everyone is different. Talk with a food expert or your doctor to help you find the exact foods to avoid, if any. Fruits Any fruits prepared with added fat. Any fruits that cause symptoms. For some people, this may include citrus fruits, such as oranges, grapefruit, pineapple, and lemons. Vegetables Deep-fried vegetables. [place] fries. Any vegetables prepared with added fat. Any vegetables that cause symptoms. For some people, this may include tomatoes and tomato products, chili peppers, onions and garlic, and horseradish. Grains Pastries or quick breads with added fat. Meats and other proteins High-fat meats, such as fatty beef or pork, hot dogs, ribs, ham, sausage, salami, and bacon. Fried meat or protein, including fried fish and fried chicken. Nuts and nut butters, in large amounts. Dairy Whole milk and chocolate milk. Sour cream. Cream. Ice cream. Cream cheese. Milkshakes. Fats and oils Butter. Margarine. Shortening. Ghee. Beverages Coffee and tea, with or without caffeine. Carbonated beverages. Sodas. Energy drinks. Fruit juice made with acidic fruits, such as orange or grapefruit. Tomato juice. Alcoholic drinks. Sweets and desserts Chocolate and cocoa. Donuts. Seasonings and condiments Pepper. Peppermint and spearmint. Added salt. Any condiments, herbs, or seasonings that cause symptoms. For some people, this may include curry, hot sauce, or vinegar-based salad dressings. The items listed above may not be a complete list of what you should not eat and drink. Contact a food expert for more options. Questions to ask your doctor Diet and lifestyle  changes are often the first steps that are taken to manage symptoms of GERD. If diet and lifestyle changes do not help, talk with your doctor about taking medicines. Where to find more information [employer] for Gastrointestinal Disorders: aboutgerd.org Summary When you have GERD, food and lifestyle choices are very important in easing your symptoms. Eat small meals often instead of 3 large meals a day. Eat your meals slowly and in a place where you are relaxed. Avoid bending over or lying down until 2-3 hours after eating. Limit high-fat foods such as fatty meats or fried foods. This information is not intended to replace advice given to you by your health care provider. Make sure you discuss any questions you have with your health care provider. Document Revised: 10/28/2019 Document Reviewed: 10/28/2019 Elsevier Patient Education  2023 Elsevier Inc.   Gastritis, Adult Gastritis is irritation and swelling (inflammation) of the stomach. There are two kinds of gastritis: Acute gastritis. This kind develops quickly. Chronic gastritis. This kind is much more common. It develops slowly and lasts for a long time. It  is important to get help for this condition. If you do not get help, your stomach can bleed, and you can get sores (ulcers) in your stomach. What are the causes? This condition may be caused by: Germs that get to your stomach and cause an infection. Drinking too much alcohol. Medicines you are taking. Having too much acid in the stomach. Having a disease of the stomach. Other causes may include: An allergic reaction. Some cancer treatments (radiation). Smoking cigarettes or using products that contain nicotine or tobacco. In some cases, the cause of this condition is not known. What increases the risk? Having a disease of the intestines. Having Crohn's disease. Using aspirin or ibuprofen and other NSAIDs to treat other conditions. Stress. What are the signs or  symptoms? Pain in your stomach. A burning feeling in your stomach. Feeling like you may vomit (nauseous). Vomiting or vomiting blood. Feeling too full after you eat. Weight loss. Bad breath. Blood in your poop (stool). In some cases, there are no symptoms. How is this treated? This condition is treated with medicines. The medicines that are used depend on what caused the condition. You may be given: Antibiotic medicine, if your condition was caused by an infection from germs. H2 blockers and similar medicines, if your condition was caused by too much acid in the stomach. Treatment may also include stopping the use of certain medicines, such as aspirin or ibuprofen. Follow these instructions at home: Medicines Take over-the-counter and prescription medicines only as told by your doctor. If you were prescribed an antibiotic medicine, take it as told by your doctor. Do not stop taking it even if you start to feel better. Alcohol use Do not drink alcohol if: Your doctor tells you not to drink. You are pregnant, may be pregnant, or are planning to become pregnant. If you drink alcohol: Limit your use to: 0-1 drink a day for women. 0-2 drinks a day for men. Know how much alcohol is in your drink. In the [place] one drink equals one 12 oz bottle of beer (355 mL), one 5 oz glass of wine (148 mL), or one 1 oz glass of hard liquor (44 mL). General instructions  Eat small meals often, instead of large meals. Avoid foods and drinks that make you feel worse. Drink enough fluid to keep your pee (urine) pale yellow. Talk with your doctor about ways to manage stress. You can exercise or do deep breathing, meditation, or yoga. Do not smoke or use any products that contain nicotine or tobacco. If you need help quitting, ask your doctor. Keep all follow-up visits. Contact a doctor if: Your symptoms get worse. Your stomach pain gets worse. Your symptoms go away and then come back. You have a  fever. Get help right away if: You vomit blood or something that looks like coffee grounds. You have black or dark red poop. You throw up any time you try to drink fluids. These symptoms may be an emergency. Get help right away. Call your local emergency services (911 in the [place] Do not wait to see if the symptoms will go away. Do not drive yourself to the hospital. Summary Gastritis is irritation and swelling (inflammation) of the stomach. You must get help for this condition. If you do not get help, your stomach can bleed, and you can get sores (ulcers) in your stomach. You can be treated with medicines for germs or medicines to block too much acid in your stomach. This information is not intended to  replace advice given to you by your health care provider. Make sure you discuss any questions you have with your health care provider. Document Revised: 08/22/2020 Document Reviewed: 08/22/2020 Elsevier Patient Education  2023 Elsevier Inc.    If you have been instructed to have an in-person evaluation today at a local Urgent Care facility, please use the link below. It will take you to a list of all of our available [facility], including address, phone number and hours of operation. Please do not delay care.  [facility]  If you or a family member do not have a primary care provider, use the link below to schedule a visit and establish care. When you choose a [facility] primary care physician or advanced practice provider, you gain a long-term partner in health. Find a Primary Care Provider  Learn more about [facility] in-office and virtual care options: [facility] Get Care Now

## 2021-10-11 NOTE — Progress Notes (Signed)
Virtual Visit Consent   Ellen Roberts, you are scheduled for a virtual visit with a [facility] provider today. Just as with appointments in the office, your consent must be obtained to participate. Your consent will be active for this visit and any virtual visit you may have with one of our providers in the next 365 days. If you have a MyChart account, a copy of this consent can be sent to you electronically.  As this is a virtual visit, video technology does not allow for your provider to perform a traditional examination. This may limit your provider's ability to fully assess your condition. If your provider identifies any concerns that need to be evaluated in person or the need to arrange testing (such as labs, EKG, etc.), we will make arrangements to do so. Although advances in technology are sophisticated, we cannot ensure that it will always work on either your end or our end. If the connection with a video visit is poor, the visit may have to be switched to a telephone visit. With either a video or telephone visit, we are not always able to ensure that we have a secure connection.  By engaging in this virtual visit, you consent to the provision of healthcare and authorize for your insurance to be billed (if applicable) for the services provided during this visit. Depending on your insurance coverage, you may receive a charge related to this service.  I need to obtain your verbal consent now. Are you willing to proceed with your visit today? Ellen Roberts has provided verbal consent on 10/11/2021 for a virtual visit (video or telephone). Margaretann Loveless, PA-C  Date: 10/11/2021 3:23 PM  Virtual Visit via Video Note   IMargaretann Loveless, connected with  Ellen Roberts  ([id number], [birth year]) on 10/11/21 at  3:15 PM EDT by a video-enabled telemedicine application and verified that I am speaking with the correct person using two identifiers.  Location: Patient: Virtual Visit Location  Patient: Home Provider: Virtual Visit Location Provider: Home Office   I discussed the limitations of evaluation and management by telemedicine and the availability of in person appointments. The patient expressed understanding and agreed to proceed.    History of Present Illness: Ellen Roberts is a 26 y.o. who identifies as a female who was assigned female at birth, and is being seen today for nausea over 2 weeks.  HPI: Emesis  This is a new problem. The current episode started 1 to 4 weeks ago (2 weeks). The problem occurs intermittently. The problem has been gradually worsening. The emesis has an appearance of stomach contents. There has been no fever. Associated symptoms include abdominal pain (epigastric). Pertinent negatives include no arthralgias, chills, diarrhea, dizziness, fever, myalgias or weight loss. She has tried nothing for the symptoms. The treatment provided no relief.   Having nausea after every meal. Has had occasional episodes of vomiting with nausea being severe enough.   Problems: There are no problems to display for this patient.   Allergies:  Allergies  Allergen Reactions   Peanut-Containing Drug Products     Ance    Aspirin Itching and Rash    Stomach pains   Medications:  Current Outpatient Medications:    sucralfate (CARAFATE) 1 g tablet, Take 1 tablet (1 g total) by mouth 4 (four) times daily -  with meals and at bedtime for 14 days., Disp: 56 tablet, Rfl: 0   ferrous sulfate 325 (65 FE) MG tablet, Take 325 mg by mouth daily with  breakfast., Disp: , Rfl:    Fiber POWD, Take by mouth 2 (two) times daily. Reported on 07/30/2015, Disp: , Rfl:    ibuprofen (ADVIL) 800 MG tablet, Take 1 tablet (800 mg total) by mouth every 8 (eight) hours as needed (PAIN)., Disp: 21 tablet, Rfl: 0  Observations/Objective: Patient is well-developed, well-nourished in no acute distress.  Resting comfortably at home.  Head is normocephalic, atraumatic.  No labored breathing.   Speech is clear and coherent with logical content.  Patient is alert and oriented at baseline.    Assessment and Plan: 1. Other acute gastritis without hemorrhage - sucralfate (CARAFATE) 1 g tablet; Take 1 tablet (1 g total) by mouth 4 (four) times daily -  with meals and at bedtime for 14 days.  Dispense: 56 tablet; Refill: 0  - Suspect gastritis vs PUD vs GERD - Will treat acutely for 2 weeks with Sucralfate - May benefit by adding a PPI (omeprazole) OTC daily once she has completed Sucralfate - Avoid spicy foods, alcohol, caffeine, food triggers - Seek in person evaluation if not improving or if symptoms worsen  Follow Up Instructions: I discussed the assessment and treatment plan with the patient. The patient was provided an opportunity to ask questions and all were answered. The patient agreed with the plan and demonstrated an understanding of the instructions.  A copy of instructions were sent to the patient via MyChart unless otherwise noted below.    The patient was advised to call back or seek an in-person evaluation if the symptoms worsen or if the condition fails to improve as anticipated.  Time:  I spent 10 minutes with the patient via telehealth technology discussing the above problems/concerns.    Margaretann Loveless, PA-C

## 2021-10-29 ENCOUNTER — Encounter: Payer: Self-pay | Admitting: Nurse Practitioner

## 2021-11-05 ENCOUNTER — Telehealth: Payer: Self-pay | Admitting: *Deleted

## 2021-11-05 NOTE — Telephone Encounter (Signed)
Line rings and then goes busy.No voicemail to leave a message.

## 2021-11-16 ENCOUNTER — Encounter: Payer: Self-pay | Admitting: Emergency Medicine

## 2021-11-16 ENCOUNTER — Ambulatory Visit: Payer: Self-pay | Admitting: Nurse Practitioner

## 2021-11-16 ENCOUNTER — Ambulatory Visit
Admission: EM | Admit: 2021-11-16 | Discharge: 2021-11-16 | Disposition: A | Discharge status: Home / Self Care | Payer: 59 | Attending: Student | Admitting: Student

## 2021-11-16 DIAGNOSIS — N760 Acute vaginitis: Secondary | ICD-10-CM | POA: Diagnosis not present

## 2021-11-16 DIAGNOSIS — B3731 Acute candidiasis of vulva and vagina: Secondary | ICD-10-CM | POA: Insufficient documentation

## 2021-11-16 DIAGNOSIS — K219 Gastro-esophageal reflux disease without esophagitis: Secondary | ICD-10-CM | POA: Insufficient documentation

## 2021-11-16 MED ORDER — FLUCONAZOLE 150 MG PO TABS: 150.0000 mg | ORAL_TABLET | Freq: Every day | ORAL | 0 refills | Status: DC

## 2021-11-16 MED ORDER — ONDANSETRON 4 MG PO TBDP: 4.0000 mg | ORAL_TABLET | Freq: Three times a day (TID) | ORAL | 0 refills | Status: DC | PRN

## 2021-11-16 MED ORDER — LANSOPRAZOLE 15 MG PO CPDR: 15.0000 mg | DELAYED_RELEASE_CAPSULE | Freq: Every day | ORAL | 0 refills | Status: DC

## 2021-11-16 NOTE — ED Triage Notes (Signed)
Pt here with vaginal itching since yesterday. Pt also c/o chronic reflux and emesis x several months. Pt states there is no chance of pregnancy.

## 2021-11-16 NOTE — Discharge Instructions (Addendum)
-  For your yeast infection, start the Diflucan (fluconazole)- Take one pill today (day 1). If you're still having symptoms in 3 days, take the second pill.  -We have sent testing for sexually transmitted infections. We will notify you of any positive results once they are received. If required, we will prescribe any medications you might need. Please refrain from all sexual activity until treatment is complete.  -Seek additional medical attention if you develop fevers/chills, new/worsening abdominal pain, new/worsening vaginal discomfort/discharge, etc.  -Lansoprazole daily x30 days -Take the Zofran (ondansetron) up to 3 times daily for nausea and vomiting. Dissolve one pill under your tongue or between your teeth and your cheek. -Limit spicy foods, acidic foods, ibuprofen/NSAIDs, coffee, etc.  -I referred you to GI - someone should call you -Follow-up with PCP for routine labwork

## 2021-11-16 NOTE — ED Provider Notes (Signed)
[facility]    CSN: [id number] Arrival date & time: 11/16/21  1247      History   Chief Complaint Chief Complaint  Patient presents with   Vaginal Itching   Nausea    HPI Ellen Roberts is a 26 y.o. female presenting with 2 complaints: Vaginal itching, months of epigastric pain and vomiting. -Has already been evaluated via video visit by PCP for the epigastric pain.  Today she endorses 2 months of epigastric burning, cramping, bilious vomiting following eating.  She states she eats a lot of spicy foods, but otherwise denies excessive NSAIDs, EtOH, rich foods, coffee on an empty stomach.  She did also eat a papaya on an empty stomach before all of this began.  Bowel movements are regular.  No prior history of abdominal procedures.  No recent antibiotics.  Sucralfate as prescribed by PCP did not help. -Also with white vaginal discharge and external vaginal irritation for 2 days.  Denies odor.  Denies STI risk.  Denies lower abdominal pain.  Denies urinary symptoms including dysuria, hematuria, frequency, urgency.  Denies flank pain.  HPI  Past Medical History:  Diagnosis Date   Anemia    No pertinent past medical history     There are no problems to display for this patient.   Past Surgical History:  Procedure Laterality Date   HERNIA REPAIR      OB History     Gravida  0   Para      Term      Preterm      AB      Living         SAB      IAB      Ectopic      Multiple      Live Births               Home Medications    Prior to Admission medications   Medication Sig Start Date End Date Taking? Authorizing Provider  fluconazole (DIFLUCAN) 150 MG tablet Take 1 tablet (150 mg total) by mouth daily. -For your yeast infection, start the Diflucan (fluconazole)- Take one pill today (day 1). If you're still having symptoms in 3 days, take the second pill. 11/16/21  Yes Rhys Martini, PA-C  lansoprazole (PREVACID) 15 MG capsule Take 1 capsule  (15 mg total) by mouth daily at 12 noon. 11/16/21  Yes Rhys Martini, PA-C  ondansetron (ZOFRAN-ODT) 4 MG disintegrating tablet Take 1 tablet (4 mg total) by mouth every 8 (eight) hours as needed for nausea or vomiting. 11/16/21  Yes Rhys Martini, PA-C  ferrous sulfate 325 (65 FE) MG tablet Take 325 mg by mouth daily with breakfast.    [provider]  Fiber POWD Take by mouth 2 (two) times daily. Reported on 07/30/2015    [provider]  ibuprofen (ADVIL) 800 MG tablet Take 1 tablet (800 mg total) by mouth every 8 (eight) hours as needed (PAIN). 05/27/19   Lurline Idol, FNP  sucralfate (CARAFATE) 1 g tablet Take 1 tablet (1 g total) by mouth 4 (four) times daily -  with meals and at bedtime for 14 days. 10/11/21 10/25/21  Margaretann Loveless, PA-C    Family History Family History  Problem Relation Age of Onset   Diabetes Mother    Hypertension Mother     Social History Social History   Tobacco Use   Smoking status: Never   Smokeless tobacco: Never  Substance Use  Topics   Alcohol use: No   Drug use: No     Allergies   Peanut-containing drug products and Aspirin   Review of Systems Review of Systems  Constitutional:  Negative for chills and fever.  HENT:  Negative for sore throat.   Eyes:  Negative for pain and redness.  Respiratory:  Negative for shortness of breath.   Cardiovascular:  Negative for chest pain.  Gastrointestinal:  Positive for abdominal pain. Negative for diarrhea, nausea and vomiting.  Genitourinary:  Positive for vaginal discharge. Negative for decreased urine volume, difficulty urinating, dysuria, flank pain, frequency, genital sores, hematuria and urgency.  Musculoskeletal:  Negative for back pain.  Skin:  Negative for rash.  All other systems reviewed and are negative.    Physical Exam Triage Vital Signs ED Triage Vitals  Enc Vitals Group     BP 11/16/21 1349 113/74     Pulse Rate 11/16/21 1349 63     Resp 11/16/21 1349  20     Temp 11/16/21 1349 98.4 F (36.9 C)     Temp src --      SpO2 11/16/21 1349 100 %     Weight --      Height --      Head Circumference --      Peak Flow --      Pain Score 11/16/21 1352 0     Pain Loc --      Pain Edu? --      Excl. in GC? --    No data found.  Updated Vital Signs BP 113/74   Pulse 63   Temp 98.4 F (36.9 C)   Resp 20   SpO2 100%   Visual Acuity Right Eye Distance:   Left Eye Distance:   Bilateral Distance:    Right Eye Near:   Left Eye Near:    Bilateral Near:     Physical Exam Vitals reviewed.  Constitutional:      General: She is not in acute distress.    Appearance: Normal appearance. She is not ill-appearing.  HENT:     Head: Normocephalic and atraumatic.     Mouth/Throat:     Mouth: Mucous membranes are moist.     Comments: Moist mucous membranes Eyes:     Extraocular Movements: Extraocular movements intact.     Pupils: Pupils are equal, round, and reactive to light.  Cardiovascular:     Rate and Rhythm: Normal rate and regular rhythm.     Heart sounds: Normal heart sounds.  Pulmonary:     Effort: Pulmonary effort is normal.     Breath sounds: Normal breath sounds. No wheezing, rhonchi or rales.  Abdominal:     General: Bowel sounds are normal. There is no distension.     Palpations: Abdomen is soft. There is no mass.     Tenderness: There is abdominal tenderness in the epigastric area. There is no right CVA tenderness, left CVA tenderness, guarding or rebound. Negative signs include Murphy's sign, Rovsing's sign and McBurney's sign.     Comments: Epigastric region mildly tender to palpation  Comfortable throughout exam.   Skin:    General: Skin is warm.     Capillary Refill: Capillary refill takes less than 2 seconds.     Comments: Good skin turgor  Neurological:     General: No focal deficit present.     Mental Status: She is alert and oriented to person, place, and time.  Psychiatric:  Mood and Affect: Mood  normal.        Behavior: Behavior normal.      UC Treatments / Results  Labs (all labs ordered are listed, but only abnormal results are displayed) Labs Reviewed  CERVICOVAGINAL ANCILLARY ONLY    EKG   Radiology No results found.  Procedures Procedures (including critical care time)  Medications Ordered in UC Medications - No data to display  Initial Impression / Assessment and Plan / UC Course  I have reviewed the triage vital signs and the nursing notes.  Pertinent labs & imaging results that were available during my care of the patient were reviewed by me and considered in my medical decision making (see chart for details).     This patient is a very pleasant 26 y.o. year old female presenting with GERD and vaginitis. Afebrile, nontachycardic, mild epigastric pain and no CVAT.  States she is not pregnant or breastfeeding. Declines u-preg.  Will send self-swab for G/C, trich, yeast, BV testing. Declines HIV, RPR. Safe sex precautions.  Will cover with diflucan for yeast while awaiting test results.   For GERD, trial of PPI.  Zofran sent as needed.  Reduce spicy foods for now.  GI referral sent.  ED return precautions discussed. Patient verbalizes understanding and agreement.    Final Clinical Impressions(s) / UC Diagnoses   Final diagnoses:  Vaginitis and vulvovaginitis  Gastroesophageal reflux disease without esophagitis     Discharge Instructions      -For your yeast infection, start the Diflucan (fluconazole)- Take one pill today (day 1). If you're still having symptoms in 3 days, take the second pill.  -We have sent testing for sexually transmitted infections. We will notify you of any positive results once they are received. If required, we will prescribe any medications you might need. Please refrain from all sexual activity until treatment is complete.  -Seek additional medical attention if you develop fevers/chills, new/worsening abdominal pain,  new/worsening vaginal discomfort/discharge, etc.  -Lansoprazole daily x30 days -Take the Zofran (ondansetron) up to 3 times daily for nausea and vomiting. Dissolve one pill under your tongue or between your teeth and your cheek. -Limit spicy foods, acidic foods, ibuprofen/NSAIDs, coffee, etc.  -I referred you to GI - someone should call you -Follow-up with PCP for routine labwork    ED Prescriptions     Medication Sig Dispense Auth. Provider   fluconazole (DIFLUCAN) 150 MG tablet Take 1 tablet (150 mg total) by mouth daily. -For your yeast infection, start the Diflucan (fluconazole)- Take one pill today (day 1). If you're still having symptoms in 3 days, take the second pill. 2 tablet Rhys Martini, PA-C   lansoprazole (PREVACID) 15 MG capsule Take 1 capsule (15 mg total) by mouth daily at 12 noon. 30 capsule Ignacia Bayley E, PA-C   ondansetron (ZOFRAN-ODT) 4 MG disintegrating tablet Take 1 tablet (4 mg total) by mouth every 8 (eight) hours as needed for nausea or vomiting. 21 tablet Rhys Martini, PA-C      PDMP not reviewed this encounter.   Rhys Martini, PA-C 11/16/21 1519

## 2021-11-17 LAB — CERVICOVAGINAL ANCILLARY ONLY
Bacterial Vaginitis (gardnerella): NEGATIVE
Candida Glabrata: NEGATIVE
Candida Vaginitis: POSITIVE — AB
Chlamydia: NEGATIVE
Comment: NEGATIVE
Comment: NEGATIVE
Comment: NEGATIVE
Comment: NEGATIVE
Comment: NEGATIVE
Comment: NORMAL
Neisseria Gonorrhea: NEGATIVE
Trichomonas: NEGATIVE

## 2021-12-30 ENCOUNTER — Encounter: Payer: 59 | Admitting: Obstetrics & Gynecology

## 2022-01-10 ENCOUNTER — Telehealth: Payer: Self-pay | Admitting: *Deleted

## 2022-01-10 NOTE — Telephone Encounter (Signed)
Patient given appointment information and office balance.

## 2022-01-12 ENCOUNTER — Ambulatory Visit (INDEPENDENT_AMBULATORY_CARE_PROVIDER_SITE_OTHER): Payer: 59 | Admitting: Family Medicine

## 2022-01-12 ENCOUNTER — Encounter: Payer: Self-pay | Admitting: Family Medicine

## 2022-01-12 VITALS — BP 112/78 | HR 75 | Temp 97.6°F | Ht 63.0 in | Wt 196.0 lb

## 2022-01-12 DIAGNOSIS — K219 Gastro-esophageal reflux disease without esophagitis: Secondary | ICD-10-CM | POA: Diagnosis not present

## 2022-01-12 DIAGNOSIS — R140 Abdominal distension (gaseous): Secondary | ICD-10-CM | POA: Diagnosis not present

## 2022-01-12 DIAGNOSIS — Z789 Other specified health status: Secondary | ICD-10-CM

## 2022-01-12 DIAGNOSIS — R6881 Early satiety: Secondary | ICD-10-CM

## 2022-01-12 DIAGNOSIS — D649 Anemia, unspecified: Secondary | ICD-10-CM

## 2022-01-12 DIAGNOSIS — R1084 Generalized abdominal pain: Secondary | ICD-10-CM

## 2022-01-12 LAB — CBC WITH DIFFERENTIAL/PLATELET
Basophils Absolute: 0 10*3/uL (ref 0.0–0.1)
Basophils Relative: 1.1 % (ref 0.0–3.0)
Eosinophils Absolute: 0 10*3/uL (ref 0.0–0.7)
Eosinophils Relative: 1 % (ref 0.0–5.0)
HCT: 38 % (ref 36.0–46.0)
Hemoglobin: 12.4 g/dL (ref 12.0–15.0)
Lymphocytes Relative: 32.6 % (ref 12.0–46.0)
Lymphs Abs: 1.2 10*3/uL (ref 0.7–4.0)
MCHC: 32.6 g/dL (ref 30.0–36.0)
MCV: 77.8 fl — ABNORMAL LOW (ref 78.0–100.0)
Monocytes Absolute: 0.3 10*3/uL (ref 0.1–1.0)
Monocytes Relative: 8.5 % (ref 3.0–12.0)
Neutro Abs: 2.1 10*3/uL (ref 1.4–7.7)
Neutrophils Relative %: 56.8 % (ref 43.0–77.0)
Platelets: 277 10*3/uL (ref 150.0–400.0)
RBC: 4.89 Mil/uL (ref 3.87–5.11)
RDW: 13.6 % (ref 11.5–15.5)
WBC: 3.7 10*3/uL — ABNORMAL LOW (ref 4.0–10.5)

## 2022-01-12 LAB — COMPREHENSIVE METABOLIC PANEL
ALT: 15 U/L (ref 0–35)
AST: 21 U/L (ref 0–37)
Albumin: 3.8 g/dL (ref 3.5–5.2)
Alkaline Phosphatase: 55 U/L (ref 39–117)
BUN: 10 mg/dL (ref 6–23)
CO2: 28 mEq/L (ref 19–32)
Calcium: 9.5 mg/dL (ref 8.4–10.5)
Chloride: 103 mEq/L (ref 96–112)
Creatinine, Ser: 0.92 mg/dL (ref 0.40–1.20)
GFR: 84.76 mL/min (ref >60.00)
Glucose, Bld: 81 mg/dL (ref 70–99)
Potassium: 4.4 mEq/L (ref 3.5–5.1)
Sodium: 137 mEq/L (ref 135–145)
Total Bilirubin: 0.4 mg/dL (ref 0.2–1.2)
Total Protein: 7.6 g/dL (ref 6.0–8.3)

## 2022-01-12 LAB — TSH: TSH: 2.12 u[IU]/mL (ref 0.35–5.50)

## 2022-01-12 LAB — IBC PANEL
Iron: 111 ug/dL (ref 42–145)
Saturation Ratios: 29.9 % (ref 20.0–50.0)
TIBC: 371 ug/dL (ref 250.0–450.0)
Transferrin: 265 mg/dL (ref 212.0–360.0)

## 2022-01-12 LAB — LIPASE: Lipase: 13 U/L (ref 11.0–59.0)

## 2022-01-12 LAB — VITAMIN B12: Vitamin B-12: 516 pg/mL (ref 211–911)

## 2022-01-12 LAB — FOLATE: Folate: 16.6 ng/mL (ref >5.9)

## 2022-01-12 MED ORDER — PANTOPRAZOLE SODIUM 40 MG PO TBEC: 40.0000 mg | DELAYED_RELEASE_TABLET | Freq: Every day | ORAL | 1 refills | Status: DC

## 2022-01-12 NOTE — Progress Notes (Addendum)
New Patient Office Visit  Subjective    Patient ID: Ellen Roberts, female    DOB: 1995/06/27  Age: 26 y.o. MRN: [id number]  CC:  Chief Complaint  Patient presents with   Establish Care    Acid reflux since May, would also like a reference for food allergy testing as some foods upset her stomach    HPI Ellen Roberts presents to establish care  C/o a 5 month hx of acid reflux. States she traveled to [place] the month prior to her symptom onset.  Feels like something is stuck in her throat even though she has not eaten today.   Increased belching, bitter taste in her mouth.  Bloating.   States her stomach hurts with milk but with other foods as well.  Gaining weight.    Takes Prevacid some days.   Denies fever, chills, night sweats,  dizziness, chest pain, palpitations, shortness of breath, diarrhea, constipation, or urinary symptoms.     Outpatient Encounter Medications as of 01/12/2022  Medication Sig   pantoprazole (PROTONIX) 40 MG tablet Take 1 tablet (40 mg total) by mouth daily.   [DISCONTINUED] lansoprazole (PREVACID) 15 MG capsule Take 1 capsule (15 mg total) by mouth daily at 12 noon.   ferrous sulfate 325 (65 FE) MG tablet Take 325 mg by mouth daily with breakfast. (Patient not taking: Reported on 01/12/2022)   Fiber POWD Take by mouth 2 (two) times daily. Reported on 07/30/2015 (Patient not taking: Reported on 01/12/2022)   ibuprofen (ADVIL) 800 MG tablet Take 1 tablet (800 mg total) by mouth every 8 (eight) hours as needed (PAIN). (Patient not taking: Reported on 01/12/2022)   [DISCONTINUED] fluconazole (DIFLUCAN) 150 MG tablet Take 1 tablet (150 mg total) by mouth daily. -For your yeast infection, start the Diflucan (fluconazole)- Take one pill today (day 1). If you're still having symptoms in 3 days, take the second pill.   [DISCONTINUED] ondansetron (ZOFRAN-ODT) 4 MG disintegrating tablet Take 1 tablet (4 mg total) by mouth every 8 (eight) hours as needed for nausea or  vomiting.   [DISCONTINUED] sucralfate (CARAFATE) 1 g tablet Take 1 tablet (1 g total) by mouth 4 (four) times daily -  with meals and at bedtime for 14 days.   No facility-administered encounter medications on file as of 01/12/2022.    Past Medical History:  Diagnosis Date   Anemia    No pertinent past medical history     Past Surgical History:  Procedure Laterality Date   HERNIA REPAIR      Family History  Problem Relation Age of Onset   Diabetes Mother    Hypertension Mother     Social History   Socioeconomic History   Marital status: Single    Spouse name: Not on file   Number of children: Not on file   Years of education: Not on file   Highest education level: Not on file  Occupational History   Not on file  Tobacco Use   Smoking status: Never   Smokeless tobacco: Never  Substance and Sexual Activity   Alcohol use: No   Drug use: No   Sexual activity: Never  Other Topics Concern   Not on file  Social History Narrative   Not on file   Social Determinants of Health   Financial Resource Strain: Not on file  Food Insecurity: Not on file  Transportation Needs: Not on file  Physical Activity: Not on file  Stress: Not on file  Social Connections: Not on  file  Intimate Partner Violence: Not on file    ROS Pertinent positives and negatives in the history of present illness.      Objective    BP 112/78 (BP Location: Left Arm, Patient Position: Sitting, Cuff Size: Large)   Pulse 75   Temp 97.6 F (36.4 C) (Temporal)   Ht 5\' 3"  (1.6 m)   Wt 196 lb (88.9 kg)   SpO2 100%   BMI 34.72 kg/m   Physical Exam Constitutional:      General: She is not in acute distress.    Appearance: She is not ill-appearing.  HENT:     Mouth/Throat:     Mouth: Mucous membranes are moist.  Eyes:     Conjunctiva/sclera: Conjunctivae normal.  Cardiovascular:     Rate and Rhythm: Normal rate and regular rhythm.  Pulmonary:     Effort: Pulmonary effort is normal.      Breath sounds: Normal breath sounds.  Abdominal:     General: Bowel sounds are normal. There is no distension.     Palpations: Abdomen is soft.     Tenderness: There is no abdominal tenderness. There is no right CVA tenderness, left CVA tenderness, guarding or rebound.  Musculoskeletal:     Cervical back: Normal range of motion and neck supple. No tenderness.  Lymphadenopathy:     Cervical: No cervical adenopathy.  Skin:    General: Skin is warm and dry.  Neurological:     General: No focal deficit present.     Mental Status: She is alert and oriented to person, place, and time.  Psychiatric:        Mood and Affect: Mood normal.        Behavior: Behavior normal.         Assessment & Plan:   Problem List Items Addressed This Visit   None Visit Diagnoses     Bloating    -  Primary   Relevant Orders   Comprehensive metabolic panel   TSH   H. pylori antibody, IgG   Lipase   Abdomen Limited RUQ (LIVER/GB)   Gastroesophageal reflux disease, unspecified whether esophagitis present       Relevant Medications   pantoprazole (PROTONIX) 40 MG tablet   Other Relevant Orders   Comprehensive metabolic panel   Generalized abdominal pain       Relevant Orders   CBC with Differential/Platelet   Comprehensive metabolic panel   TSH   H. pylori antibody, IgG   Lipase   US Abdomen Limited RUQ (LIVER/GB)   Early satiety       Relevant Orders   Comprehensive metabolic panel   TSH   US Abdomen Limited RUQ (LIVER/GB)   Anemia, unspecified type       Relevant Orders   CBC with Differential/Platelet   Comprehensive metabolic panel   IBC panel   Vitamin B12   Folate   Recent foreign travel       Relevant Orders   H. pylori antibody, IgG      She is not in acute distress. Exam is fairly benign. Symptom onset one month after foreign travel.  Pantoprazole 40 mg daily. Avoid food triggers. Avoid eating and laying down.  Discussed possible etiologies including gallbladder,  GERD, stomach ulcer, H. Pylori, pancreatitis.  Will check labs including H. Pylori antibody and get a RUQ [place]  Will most likely need to refer to GI once I have results.  Hx of anemia, check CBC and IBC panel.  Follow up pending results.   Return for pending labs.   Hetty Blend, NP-C

## 2022-01-12 NOTE — Patient Instructions (Addendum)
Obgyn Offices:   [facility] [address] [place], [place] [address] [phone]  [facility] Address: [address] [place], [place] [address] Phone: [phone]  [facility] [address] [place], [place] [address] Phone: [phone]   [facility] [address] , [place] [address] Phone: [phone] 

## 2022-01-13 LAB — H. PYLORI ANTIBODY, IGG: H Pylori IgG: POSITIVE — AB

## 2022-01-14 NOTE — Progress Notes (Signed)
Needs ov with me in 2 weeks to discuss the positive H. Pylori blood test. This is a bacteria but the positive test result does not tell us if this is an active infection or not. Continue taking the pantoprazole daily until she sees me. I also recommend scheduling with GI. Referral was placed on 11/16/2021.

## 2022-01-19 ENCOUNTER — Ambulatory Visit
Admission: RE | Admit: 2022-01-19 | Discharge: 2022-01-19 | Disposition: A | Discharge status: Home / Self Care | Payer: 59 | Source: Ambulatory Visit | Attending: Family Medicine | Admitting: Family Medicine

## 2022-01-19 DIAGNOSIS — R6881 Early satiety: Secondary | ICD-10-CM

## 2022-01-19 DIAGNOSIS — R1084 Generalized abdominal pain: Secondary | ICD-10-CM

## 2022-01-19 DIAGNOSIS — R1013 Epigastric pain: Secondary | ICD-10-CM | POA: Diagnosis not present

## 2022-01-19 DIAGNOSIS — R140 Abdominal distension (gaseous): Secondary | ICD-10-CM

## 2022-01-20 NOTE — Progress Notes (Signed)
Please check on referral to GI. Has it been approved or scheduled?

## 2022-01-26 ENCOUNTER — Ambulatory Visit: Payer: 59 | Admitting: Family Medicine

## 2022-01-27 ENCOUNTER — Encounter: Payer: Self-pay | Admitting: Family Medicine

## 2022-01-27 ENCOUNTER — Ambulatory Visit (INDEPENDENT_AMBULATORY_CARE_PROVIDER_SITE_OTHER): Payer: 59 | Admitting: Family Medicine

## 2022-01-27 ENCOUNTER — Encounter: Payer: Self-pay | Admitting: Internal Medicine

## 2022-01-27 VITALS — BP 120/72 | HR 73 | Temp 97.6°F | Ht 63.0 in | Wt 198.0 lb

## 2022-01-27 DIAGNOSIS — R768 Other specified abnormal immunological findings in serum: Secondary | ICD-10-CM | POA: Diagnosis not present

## 2022-01-27 DIAGNOSIS — K219 Gastro-esophageal reflux disease without esophagitis: Secondary | ICD-10-CM | POA: Diagnosis not present

## 2022-01-27 NOTE — Progress Notes (Signed)
Subjective:     Patient ID: Ellen Roberts, female    DOB: 1995-06-28, 28 y.o.   MRN: [id number]  Chief Complaint  Patient presents with   Follow-up    2 week f/u to discuss positive H. Pylori blood test    HPI Patient is in today for a follow up on a 5 month hx of abdominal pain, bloating and bitter taste.   She has been taking pantoprazole and states her symptoms have improved.   Negative RUQ [place] +H. Pylori antibody test.   There are no preventive care reminders to display for this patient.   Past Medical History:  Diagnosis Date   Anemia    No pertinent past medical history     Past Surgical History:  Procedure Laterality Date   HERNIA REPAIR      Family History  Problem Relation Age of Onset   Diabetes Mother    Hypertension Mother     Social History   Socioeconomic History   Marital status: Single    Spouse name: Not on file   Number of children: Not on file   Years of education: Not on file   Highest education level: Not on file  Occupational History   Not on file  Tobacco Use   Smoking status: Never   Smokeless tobacco: Never  Substance and Sexual Activity   Alcohol use: No   Drug use: No   Sexual activity: Never  Other Topics Concern   Not on file  Social History Narrative   Not on file   Social Determinants of Health   Financial Resource Strain: Not on file  Food Insecurity: Not on file  Transportation Needs: Not on file  Physical Activity: Not on file  Stress: Not on file  Social Connections: Not on file  Intimate Partner Violence: Not on file    Outpatient Medications Prior to Visit  Medication Sig Dispense Refill   pantoprazole (PROTONIX) 40 MG tablet Take 1 tablet (40 mg total) by mouth daily. 30 tablet 1   ferrous sulfate 325 (65 FE) MG tablet Take 325 mg by mouth daily with breakfast. (Patient not taking: Reported on 01/12/2022)     Fiber POWD Take by mouth 2 (two) times daily. Reported on 07/30/2015 (Patient not taking:  Reported on 01/12/2022)     ibuprofen (ADVIL) 800 MG tablet Take 1 tablet (800 mg total) by mouth every 8 (eight) hours as needed (PAIN). (Patient not taking: Reported on 01/12/2022) 21 tablet 0   No facility-administered medications prior to visit.    Allergies  Allergen Reactions   Peanut-Containing Drug Products     Ance    Aspirin Itching and Rash    Stomach pains    ROS     Objective:    Physical Exam  BP 120/72 (BP Location: Left Arm, Patient Position: Sitting, Cuff Size: Large)   Pulse 73   Temp 97.6 F (36.4 C) (Temporal)   Ht 5\' 3"  (1.6 m)   Wt 198 lb (89.8 kg)   SpO2 100%   BMI 35.07 kg/m  Wt Readings from Last 3 Encounters:  01/27/22 198 lb (89.8 kg)  01/12/22 196 lb (88.9 kg)  10/30/19 174 lb 3.2 oz (79 kg)   Alert and oriented and in no acute distress. Respirations unlabored. Normal mood.      Assessment & Plan:   Problem List Items Addressed This Visit   None Visit Diagnoses     Helicobacter pylori ab+    -  Primary   Gastroesophageal reflux disease, unspecified whether esophagitis present          Follow-up today to discuss recent negative ultrasound result and positive H. pylori blood test.  Labs were unremarkable otherwise. GI symptoms have improved since taking pantoprazole. Referral to GI for positive H. pylori antibody and further evaluation.  I am having Glayds Aday maintain her ferrous sulfate, Fiber, ibuprofen, and pantoprazole.  No orders of the defined types were placed in this encounter.

## 2022-01-27 NOTE — Patient Instructions (Signed)
Please call and schedule with [facility] [phone] Address: [address], [place]   Let them know you tested positive for H. Pylori antibody and are being treated for abdominal pain and acid reflux.   Dermatology offices  [facility]: [phone] Address: [address] [place], [place] [address] [place], [place] [address]  [facility]: Phone #: [phone] Address: [address] [place], [place] [address]  [facility]: Phone: [phone]  Address: [address] [place], [place] [address]  [facility] Address: [address] [place], [place], [place] [address] Phone: [phone]

## 2022-03-07 ENCOUNTER — Other Ambulatory Visit: Payer: Self-pay | Admitting: Internal Medicine

## 2022-03-07 ENCOUNTER — Ambulatory Visit: Payer: 59 | Admitting: Internal Medicine

## 2022-03-07 ENCOUNTER — Encounter: Payer: Self-pay | Admitting: Internal Medicine

## 2022-03-07 VITALS — BP 118/78 | HR 79 | Ht 63.0 in | Wt 191.6 lb

## 2022-03-07 DIAGNOSIS — K219 Gastro-esophageal reflux disease without esophagitis: Secondary | ICD-10-CM

## 2022-03-07 DIAGNOSIS — R112 Nausea with vomiting, unspecified: Secondary | ICD-10-CM | POA: Diagnosis not present

## 2022-03-07 DIAGNOSIS — R1013 Epigastric pain: Secondary | ICD-10-CM | POA: Diagnosis not present

## 2022-03-07 DIAGNOSIS — R768 Other specified abnormal immunological findings in serum: Secondary | ICD-10-CM

## 2022-03-07 MED ORDER — PANTOPRAZOLE SODIUM 40 MG PO TBEC: 40.0000 mg | DELAYED_RELEASE_TABLET | Freq: Two times a day (BID) | ORAL | 2 refills | Status: DC

## 2022-03-07 MED ORDER — ONDANSETRON HCL 4 MG PO TABS: 4.0000 mg | ORAL_TABLET | Freq: Three times a day (TID) | ORAL | 2 refills | Status: DC | PRN

## 2022-03-07 NOTE — Progress Notes (Signed)
Chief Complaint: Positive H pylori antibody  HPI : 26 year old female with history of anemia presents with positive H pylori antibody  Starting at the end of 08/2021 after she returned from [place], she started feeling a lot of reflux when she eats. She would have N&V. Her appetite has been gone because she doesn't want to experience the reflux sensation after eating. She was found to have a positive H pylori antibody and has never been treated for H pylori in the past. She was treated with pantoprazole, which has been working to keep her symptoms under control, but she wants to solve the problem, not just take medications to suppress her symptoms. She didn't take the pantoprazole yesterday or today. She will have N&V if she doesn't take the PPI. She has lost about 5-10 lbs since her GI symptoms started. Denies melena, hematochezia, changes in bowel habits, diarrhea, or constipation. Denies dysphagia over the long term. Has some discomfort in th epigastric area. Denies family history of GI issues.  Wt Readings from Last 3 Encounters:  03/07/22 191 lb 9.6 oz (86.9 kg)  01/27/22 198 lb (89.8 kg)  01/12/22 196 lb (88.9 kg)   Past Medical History:  Diagnosis Date   Anemia    No pertinent past medical history    Past Surgical History:  Procedure Laterality Date   HERNIA REPAIR     Family History  Problem Relation Age of Onset   Diabetes Mother    Hypertension Mother    Colon cancer Neg Hx    Liver disease Neg Hx    Esophageal cancer Neg Hx    Social History   Tobacco Use   Smoking status: Never   Smokeless tobacco: Never  Substance Use Topics   Alcohol use: No   Drug use: No   Current Outpatient Medications  Medication Sig Dispense Refill   Fiber POWD Take by mouth 2 (two) times daily. Reported on 07/30/2015     ferrous sulfate 325 (65 FE) MG tablet Take 325 mg by mouth daily with breakfast. (Patient not taking: Reported on 01/12/2022)     pantoprazole (PROTONIX) 40 MG tablet Take  1 tablet (40 mg total) by mouth daily. (Patient not taking: Reported on 03/07/2022) 30 tablet 1   No current facility-administered medications for this visit.   Allergies  Allergen Reactions   Peanut-Containing Drug Products     Ance    Aspirin Itching and Rash    Stomach pains   Review of Systems: All systems reviewed and negative except where noted in HPI.   Physical Exam: BP 118/78   Pulse 79   Ht 5\' 3"  (1.6 m)   Wt 191 lb 9.6 oz (86.9 kg)   SpO2 98%   BMI 33.94 kg/m  Constitutional: Pleasant,well-developed, female in no acute distress. HEENT: Normocephalic and atraumatic. Conjunctivae are normal. No scleral icterus. Cardiovascular: Normal rate, regular rhythm.  Pulmonary/chest: Effort normal and breath sounds normal. No wheezing, rales or rhonchi. Abdominal: Soft, nondistended, mild discomfort in epigastric area. Bowel sounds active throughout. There are no masses palpable. No hepatomegaly. Extremities: No edema Neurological: Alert and oriented to person place and time. Skin: Skin is warm and dry. No rashes noted. Psychiatric: Normal mood and affect. Behavior is normal.  Labs 12/2021: CBC with low WBC of 3.7. CMP nml. TSH nml. Ferritin/IBC nml. B12/folate nml. H pylori IgG positive. Lipase nml.   RUQ U/S 01/19/22: IMPRESSION: No cholelithiasis or sonographic evidence for acute cholecystitis.  ASSESSMENT AND PLAN: Positive  H pylori antibody Epigastric ab discomfort GERD N&V Patient presents with worsening of GERD, epigastric ab discomfort, and N&V over the last 5 months. She recently had a H pylori antibody that was positive, suggesting that she likely has H pylori infection. I will plan to confirm active infection with a H pylori stool antigen. Patient can stay on PPI therapy for the Diatherix stool test.  - Cont pantoprazole 40 mg BID - Start Zofran 4 mg TID PRN - Check Diatherix H pylori stool antigen  Eulah Pont, MD

## 2022-03-07 NOTE — Patient Instructions (Addendum)
_______________________________________________________  If you are age 28 or older, your body mass index should be between 23-30. Your Body mass index is 33.94 kg/m. If this is out of the aforementioned range listed, please consider follow up with your Primary Care Provider.  If you are age 38 or younger, your body mass index should be between 19-25. Your Body mass index is 33.94 kg/m. If this is out of the aformentioned range listed, please consider follow up with your Primary Care Provider.   ________________________________________________________  The [place] [facility] providers would like to encourage you to use [facility] to communicate with providers for non-urgent requests or questions.  Due to long hold times on the telephone, sending your provider a message by [facility] may be a faster and more efficient way to get a response.  Please allow 48 business hours for a response.  Please remember that this is for non-urgent requests.  _______________________________________________________ We have sent the following medications to your pharmacy for you to pick up at your convenience:  Restart  pantoprazole 40 mg twice a day Start taking Zofran 4 mg  3  times daily  Thank you for entrusting me with your care and choosing [facility].  Dr Lorenso Courier

## 2022-03-08 ENCOUNTER — Other Ambulatory Visit (HOSPITAL_COMMUNITY): Payer: Self-pay

## 2022-03-09 ENCOUNTER — Telehealth: Payer: Self-pay

## 2022-03-09 ENCOUNTER — Telehealth: Payer: Self-pay | Admitting: Internal Medicine

## 2022-03-09 ENCOUNTER — Other Ambulatory Visit (HOSPITAL_COMMUNITY): Payer: Self-pay

## 2022-03-09 NOTE — Telephone Encounter (Signed)
Patient said the lansoprazole needs prior authorization from her insurance. A copy of the insurance card is now scanned into EPIC. Thank you

## 2022-03-09 NOTE — Telephone Encounter (Signed)
Spoke with the patient. She will come to the office for another [employer] test kit to test for H Pylori as per Dr Lorenso Courier at the patient's office visit on 03/07/22. Message routed to Rx Prior Authorization Team about the lansoprazole.

## 2022-03-09 NOTE — Telephone Encounter (Signed)
Inbound call from patient states she lost her stool kit sample so she was requesting a new one. She also states her rx prevacid needs prior authorization. Please advise.

## 2022-03-10 ENCOUNTER — Encounter: Payer: Self-pay | Admitting: Obstetrics & Gynecology

## 2022-03-10 ENCOUNTER — Ambulatory Visit (INDEPENDENT_AMBULATORY_CARE_PROVIDER_SITE_OTHER): Payer: 59 | Admitting: Obstetrics & Gynecology

## 2022-03-10 ENCOUNTER — Other Ambulatory Visit (HOSPITAL_COMMUNITY): Payer: Self-pay

## 2022-03-10 ENCOUNTER — Telehealth: Payer: Self-pay | Admitting: Pharmacy Technician

## 2022-03-10 ENCOUNTER — Other Ambulatory Visit (HOSPITAL_COMMUNITY)
Admission: RE | Admit: 2022-03-10 | Discharge: 2022-03-10 | Disposition: A | Discharge status: Home / Self Care | Payer: 59 | Source: Ambulatory Visit | Attending: Obstetrics & Gynecology | Admitting: Obstetrics & Gynecology

## 2022-03-10 VITALS — BP 118/82 | HR 105 | Resp 16 | Ht 63.0 in | Wt 189.0 lb

## 2022-03-10 DIAGNOSIS — Z01419 Encounter for gynecological examination (general) (routine) without abnormal findings: Secondary | ICD-10-CM | POA: Insufficient documentation

## 2022-03-10 NOTE — Telephone Encounter (Signed)
Pharmacy Patient Advocate Encounter  Prior Authorization for Lansoprazole 30MG  has been approved.    PA# Effective dates: 03/09/2022 through 03/09/2023

## 2022-03-10 NOTE — Telephone Encounter (Signed)
Patient Advocate Encounter  Prior Authorization for LANSOPRAZOLE 30MG  has been approved.    PA# Effective dates:  through   [id number] CPhT PEmeterio Reeve [phone] F: [phone]   Received notification from [employer] that prior authorization for LANSOPRAZOLE 30MG  is required.   PA submitted on 11.9.23 Key [id number] Status is pending    , CPhT Patient Advocate Phone: [phone]

## 2022-03-10 NOTE — Telephone Encounter (Signed)
PA has been submitted and approved for Lansoprazole 30mg 

## 2022-03-10 NOTE — Telephone Encounter (Signed)
PA has been submitted and approved for Lansoprazole 30mg 

## 2022-03-10 NOTE — Progress Notes (Signed)
GYNECOLOGY ANNUAL PREVENTATIVE CARE ENCOUNTER NOTE  History:     Ellen Roberts is a 26 y.o. G0P0 female here for a routine annual gynecologic exam.  Current complaints: none.  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   Denies abnormal vaginal bleeding, discharge, pelvic pain, problems with intercourse or other gynecologic concerns.    Gynecologic History No LMP recorded. Contraception: condoms planned for when husband returns from [place] Last Pap: 10/26/2018 which was normal  Obstetric History OB History  Gravida Para Term Preterm AB Living  0            SAB IAB Ectopic Multiple Live Births               Past Medical History:  Diagnosis Date   Anemia     Past Surgical History:  Procedure Laterality Date   HERNIA REPAIR      Current Outpatient Medications on File Prior to Visit  Medication Sig Dispense Refill   lansoprazole (PREVACID) 30 MG capsule Take 1 capsule (30 mg total) by mouth 2 (two) times daily before a meal. 60 capsule 2   ondansetron (ZOFRAN) 4 MG tablet Take 1 tablet (4 mg total) by mouth 3 (three) times daily as needed for nausea or vomiting. 30 tablet 2   No current facility-administered medications on file prior to visit.    Allergies  Allergen Reactions   Peanut-Containing Drug Products     Ance    Aspirin Itching and Rash    Stomach pains    Social History:  reports that she has never smoked. She has never used smokeless tobacco. She reports that she does not drink alcohol and does not use drugs.  Family History  Problem Relation Age of Onset   Diabetes Mother    Hypertension Mother    Colon cancer Neg Hx    Liver disease Neg Hx    Esophageal cancer Neg Hx     The following portions of the patient's history were reviewed and updated as appropriate: allergies, current medications, past family history, past medical history, past social history, past surgical history and problem list.  Review of Systems Pertinent items noted in HPI and  remainder of comprehensive ROS otherwise negative.  Physical Exam:  BP 118/82   Pulse (!) 105   Resp 16   Ht 5\' 3"  (1.6 m)   Wt 189 lb (85.7 kg)   BMI 33.48 kg/m  CONSTITUTIONAL: Well-developed, well-nourished female in no acute distress.  HENT:  Normocephalic, atraumatic, External right and left ear normal.  EYES: Conjunctivae and EOM are normal. Pupils are equal, round, and reactive to light. No scleral icterus.  NECK: Normal range of motion, supple, no masses.  Normal thyroid.  SKIN: Skin is warm and dry. No rash noted. Not diaphoretic. No erythema. No pallor. MUSCULOSKELETAL: Normal range of motion. No tenderness.  No cyanosis, clubbing, or edema. NEUROLOGIC: Alert and oriented to person, place, and time. Normal reflexes, muscle tone coordination.  PSYCHIATRIC: Normal mood and affect. Normal behavior. Normal judgment and thought content. CARDIOVASCULAR: Normal heart rate noted, regular rhythm RESPIRATORY: Clear to auscultation bilaterally. Effort and breath sounds normal, no problems with respiration noted. BREASTS: Symmetric in size. No masses, tenderness, skin changes, nipple drainage, or lymphadenopathy bilaterally. Performed in the presence of a chaperone. ABDOMEN: Soft, no distention noted.  No tenderness, rebound or guarding.  PELVIC: Normal appearing external genitalia and urethral meatus; normal appearing vaginal mucosa and cervix.  No abnormal vaginal discharge noted.  Pap  smear obtained.  Normal uterine size, no other palpable masses, no uterine or adnexal tenderness.  Performed in the presence of a chaperone.   Assessment and Plan:    Well woman exam with routine gynecological exam - Cytology - PAP( [place]) Will follow up results of pap smear and manage accordingly. Routine preventative health maintenance measures emphasized. Please refer to After Visit Summary for other counseling recommendations.      Jaynie Collins, MD, FACOG Obstetrician & Gynecologist,  [facility] for [employer], [facility]

## 2022-03-11 LAB — CYTOLOGY - PAP: Diagnosis: NEGATIVE

## 2022-03-16 ENCOUNTER — Encounter: Payer: Self-pay | Admitting: Internal Medicine

## 2022-03-16 NOTE — Progress Notes (Signed)
Patient's stool H pylori test from [facility] came back as positive for H pylori. She will need to be treated. Beth, please have her start treatment with bismuth quadruple therapy:   - Tetracycline 500 mg QID x 14 days - Flagyl 250 mg QID x 14 days - Bismuth subsalicylate 524 mg QID x 14 days - PPI BID x 14 days  Please arrange for follow up with me in clinic in 6 weeks.

## 2022-03-18 ENCOUNTER — Other Ambulatory Visit: Payer: Self-pay

## 2022-03-18 MED ORDER — METRONIDAZOLE 250 MG PO TABS: 250.0000 mg | ORAL_TABLET | Freq: Four times a day (QID) | ORAL | 0 refills | Status: AC

## 2022-03-18 MED ORDER — TETRACYCLINE HCL 500 MG PO CAPS
500.0000 mg | ORAL_CAPSULE | Freq: Four times a day (QID) | ORAL | 0 refills | Status: AC
Start: 2022-03-18 — End: 2022-04-01

## 2022-03-29 ENCOUNTER — Telehealth: Payer: Self-pay | Admitting: Family Medicine

## 2022-03-29 NOTE — Telephone Encounter (Signed)
Called pt and attempted to schedule an OV as this requires further testing and antibiotics are not sent in without testing. Pt declined scheduling as our next available appt was this Friday and she did not want to wait that long. Pt was frustrated because she just wants the meds sent in as this happened months ago at another office and they sent her something without being seen. I relayed that [facility] typically does not send in antibiotics without being seen. Pt states she will figure out something else.

## 2022-03-29 NOTE — Telephone Encounter (Signed)
Patient called asking for prescription to be sent to local pharmacy [facility] on [address] for a yeast infection. Callback number [phone] if there are any questions.

## 2022-03-31 ENCOUNTER — Other Ambulatory Visit: Payer: Self-pay

## 2022-03-31 ENCOUNTER — Ambulatory Visit: Payer: 59

## 2022-03-31 ENCOUNTER — Encounter (HOSPITAL_COMMUNITY): Payer: Self-pay | Admitting: Emergency Medicine

## 2022-03-31 ENCOUNTER — Ambulatory Visit (HOSPITAL_COMMUNITY)
Admission: EM | Admit: 2022-03-31 | Discharge: 2022-03-31 | Disposition: A | Discharge status: Home / Self Care | Payer: 59 | Attending: Internal Medicine | Admitting: Internal Medicine

## 2022-03-31 DIAGNOSIS — B3731 Acute candidiasis of vulva and vagina: Secondary | ICD-10-CM

## 2022-03-31 MED ORDER — FLUCONAZOLE 150 MG PO TABS
150.0000 mg | ORAL_TABLET | Freq: Every day | ORAL | 0 refills | Status: AC
Start: 2022-03-31 — End: 2022-04-02

## 2022-03-31 NOTE — Discharge Instructions (Addendum)
Please take medications as prescribed You most likely have vaginal yeast infection Return to urgent care if you have any other concerns.

## 2022-03-31 NOTE — ED Triage Notes (Signed)
Vaginal irritation started 2 days ago.  Minimal discharge  denies abdominal pain or back pain.    Patient is taking medications for a different issue.    Left side of neck is sore, onset yesterday.

## 2022-04-03 NOTE — ED Provider Notes (Signed)
[facility]    CSN: [id number] Arrival date & time: 03/31/22  1635      History   Chief Complaint Chief Complaint  Patient presents with   vaginal irritation    HPI Ellen Roberts is a 26 y.o. female comes to urgent care with 2-day history of itchy vaginal irritation.  Patient says symptoms have been persistent.  She denies has minimal discharge.  No vaginal odor.  She denies any dysuria, urgency or frequency.  No abdominal pain, bloating or distention.  Patient recently completed a course of antibiotics.  Patient complains of mild to moderate left-sided neck pain which started yesterday morning.  Pain is sharp and throbbing in nature.  She suspect that she slept wrong.  No weakness in the upper extremities.  No numbness or tingling.Marland Kitchen   HPI  Past Medical History:  Diagnosis Date   Anemia     There are no problems to display for this patient.   Past Surgical History:  Procedure Laterality Date   HERNIA REPAIR      OB History     Gravida  0   Para      Term      Preterm      AB      Living         SAB      IAB      Ectopic      Multiple      Live Births               Home Medications    Prior to Admission medications   Medication Sig Start Date End Date Taking? Authorizing Provider  lansoprazole (PREVACID) 30 MG capsule Take 1 capsule (30 mg total) by mouth 2 (two) times daily before a meal. 03/07/22   Imogene Burn, MD  ondansetron (ZOFRAN) 4 MG tablet Take 1 tablet (4 mg total) by mouth 3 (three) times daily as needed for nausea or vomiting. 03/07/22   Imogene Burn, MD    Family History Family History  Problem Relation Age of Onset   Diabetes Mother    Hypertension Mother    Colon cancer Neg Hx    Liver disease Neg Hx    Esophageal cancer Neg Hx     Social History Social History   Tobacco Use   Smoking status: Never   Smokeless tobacco: Never  Vaping Use   Vaping Use: Never used  Substance Use Topics   Alcohol  use: No   Drug use: No     Allergies   Peanut-containing drug products and Aspirin   Review of Systems Review of Systems  Respiratory: Negative.    Cardiovascular: Negative.   Gastrointestinal: Negative.   Genitourinary:  Negative for dysuria, flank pain, vaginal bleeding, vaginal discharge and vaginal pain.     Physical Exam Triage Vital Signs ED Triage Vitals  Enc Vitals Group     BP 03/31/22 1824 108/67     Pulse Rate 03/31/22 1824 77     Resp 03/31/22 1824 16     Temp 03/31/22 1824 98.5 F (36.9 C)     Temp Source 03/31/22 1824 Oral     SpO2 03/31/22 1824 100 %     Weight --      Height --      Head Circumference --      Peak Flow --      Pain Score 03/31/22 1822 6     Pain Loc --  Pain Edu? --      Excl. in GC? --    No data found.  Updated Vital Signs BP 108/67 (BP Location: Right Arm)   Pulse 77   Temp 98.5 F (36.9 C) (Oral)   Resp 16   LMP 03/16/2022   SpO2 100%   Visual Acuity Right Eye Distance:   Left Eye Distance:   Bilateral Distance:    Right Eye Near:   Left Eye Near:    Bilateral Near:     Physical Exam Vitals and nursing note reviewed.  Constitutional:      Appearance: Normal appearance.  Neck:     Vascular: No carotid bruit.  Cardiovascular:     Rate and Rhythm: Normal rate and regular rhythm.     Pulses: Normal pulses.     Heart sounds: Normal heart sounds.  Pulmonary:     Effort: Pulmonary effort is normal.     Breath sounds: Normal breath sounds.  Abdominal:     General: Bowel sounds are normal.     Palpations: Abdomen is soft.     Tenderness: There is no abdominal tenderness. There is no guarding or rebound.     Hernia: No hernia is present.  Musculoskeletal:     Cervical back: Normal range of motion and neck supple. No rigidity or tenderness.  Neurological:     Mental Status: She is alert.      UC Treatments / Results  Labs (all labs ordered are listed, but only abnormal results are displayed) Labs  Reviewed - No data to display  EKG   Radiology No results found.  Procedures Procedures (including critical care time)  Medications Ordered in UC Medications - No data to display  Initial Impression / Assessment and Plan / UC Course  I have reviewed the triage vital signs and the nursing notes.  Pertinent labs & imaging results that were available during my care of the patient were reviewed by me and considered in my medical decision making (see chart for details).     1.  Vaginal yeast infection: Fluconazole 150 mg -2 doses No indication for cervical vaginal swab testing No concerns for STI Return precautions given Patient is encouraged to use mild soap or mild vaginal wash.  Final Clinical Impressions(s) / UC Diagnoses   Final diagnoses:  Vaginal yeast infection     Discharge Instructions      Please take medications as prescribed You most likely have vaginal yeast infection Return to urgent care if you have any other concerns.   ED Prescriptions     Medication Sig Dispense Auth. Provider   fluconazole (DIFLUCAN) 150 MG tablet Take 1 tablet (150 mg total) by mouth daily for 2 doses. Please take second dose if there is no improvement in symptoms 3 days after the first dose. 2 tablet Myrlene Riera, Britta Mccreedy, MD      PDMP not reviewed this encounter.   Merrilee Jansky, MD 04/03/22 1455

## 2022-04-08 ENCOUNTER — Other Ambulatory Visit: Payer: Self-pay | Admitting: Family Medicine

## 2022-04-08 ENCOUNTER — Other Ambulatory Visit: Payer: Self-pay

## 2022-04-13 ENCOUNTER — Ambulatory Visit
Admission: EM | Admit: 2022-04-13 | Discharge: 2022-04-13 | Disposition: A | Discharge status: Home / Self Care | Payer: 59 | Attending: Urgent Care | Admitting: Urgent Care

## 2022-04-13 DIAGNOSIS — M7989 Other specified soft tissue disorders: Secondary | ICD-10-CM

## 2022-04-13 DIAGNOSIS — M79662 Pain in left lower leg: Secondary | ICD-10-CM

## 2022-04-13 MED ORDER — ACETAMINOPHEN 325 MG PO TABS: 650.0000 mg | ORAL_TABLET | Freq: Four times a day (QID) | ORAL | 0 refills | Status: DC | PRN

## 2022-04-13 NOTE — Discharge Instructions (Addendum)
Please call first thing tomorrow at [phone]. This is important to schedule your ultrasound as soon as possible to rule out a dvt. Do not use any nonsteroidal anti-inflammatories (NSAIDs) like ibuprofen, Motrin, naproxen, Aleve, etc. which are all available over-the-counter.  Please just use Tylenol at a dose of 500mg -650mg  once every 6 hours as needed for your aches, pains, fevers. Elevate your leg tonight and I'll reach out to you with your blood test results as soon as they come back.

## 2022-04-13 NOTE — ED Provider Notes (Signed)
[facility]  Note:  This document was prepared using Systems analyst and may include unintentional dictation errors.  MRN: [id number] DOB: [birth year]  Subjective:   Ellen Roberts is a 26 y.o. female presenting for 1 day history of acute onset left lower leg pain, swelling that starts at the ankle and is extending upwards towards the proximal leg.  No fall, trauma.  No wounds.  No history of clotting disorders, DVT.  No history of gout.  No history of cellulitis.  No history of peripheral arterial disease.  Patient is not a smoker.  No drug use.  Patient recently finished taking tetracycline, metronidazole, lansoprazole and completed this treatment about 2 weeks ago.   No current facility-administered medications for this encounter.  Current Outpatient Medications:    lansoprazole (PREVACID) 30 MG capsule, Take 1 capsule (30 mg total) by mouth 2 (two) times daily before a meal., Disp: 60 capsule, Rfl: 2   ondansetron (ZOFRAN) 4 MG tablet, Take 1 tablet (4 mg total) by mouth 3 (three) times daily as needed for nausea or vomiting., Disp: 30 tablet, Rfl: 2   pantoprazole (PROTONIX) 40 MG tablet, Take 40 mg by mouth daily., Disp: , Rfl:    Allergies  Allergen Reactions   Peanut-Containing Drug Products     Ance    Aspirin Itching and Rash    Stomach pains    Past Medical History:  Diagnosis Date   Anemia      Past Surgical History:  Procedure Laterality Date   HERNIA REPAIR      Family History  Problem Relation Age of Onset   Diabetes Mother    Hypertension Mother    Colon cancer Neg Hx    Liver disease Neg Hx    Esophageal cancer Neg Hx     Social History   Tobacco Use   Smoking status: Never   Smokeless tobacco: Never  Vaping Use   Vaping Use: Never used  Substance Use Topics   Alcohol use: No   Drug use: No    ROS   Objective:   Vitals: BP 118/73 (BP Location: Left Arm)   Pulse 73   Temp 98.9 F (37.2 C) (Oral)    Resp 16   LMP 03/16/2022   SpO2 98%   Physical Exam Constitutional:      General: She is not in acute distress.    Appearance: Normal appearance. She is well-developed. She is not ill-appearing, toxic-appearing or diaphoretic.  HENT:     Head: Normocephalic and atraumatic.     Nose: Nose normal.     Mouth/Throat:     Mouth: Mucous membranes are moist.  Eyes:     General: No scleral icterus.       Right eye: No discharge.        Left eye: No discharge.     Extraocular Movements: Extraocular movements intact.  Cardiovascular:     Rate and Rhythm: Normal rate and regular rhythm.     Heart sounds: Normal heart sounds. No murmur heard.    No friction rub. No gallop.  Pulmonary:     Effort: Pulmonary effort is normal. No respiratory distress.     Breath sounds: No stridor. No wheezing, rhonchi or rales.  Chest:     Chest wall: No tenderness.  Musculoskeletal:       Legs:     Comments: Positive Homans' sign.  Skin:    General: Skin is warm and dry.  Neurological:  General: No focal deficit present.     Mental Status: She is alert and oriented to person, place, and time.  Psychiatric:        Mood and Affect: Mood normal.        Behavior: Behavior normal.     Results for orders placed or performed during the hospital encounter of 04/13/22 (from the past 24 hour(s))  CBC with Differential/Platelet     Status: Abnormal   Collection Time: 04/13/22  6:36 PM  Result Value Ref Range   WBC 3.8 3.4 - 10.8 x10E3/uL   RBC 4.63 3.77 - 5.28 x10E6/uL   Hemoglobin 11.4 11.1 - 15.9 g/dL   Hematocrit 37.2 34.0 - 46.6 %   MCV 80 79 - 97 fL   MCH 24.6 (L) 26.6 - 33.0 pg   MCHC 30.6 (L) 31.5 - 35.7 g/dL   RDW 14.1 11.7 - 15.4 %   Platelets 239 150 - 450 x10E3/uL   Neutrophils 51 Not Estab. %   Lymphs 34 Not Estab. %   Monocytes 13 Not Estab. %   Eos 1 Not Estab. %   Basos 1 Not Estab. %   Neutrophils Absolute 2.0 1.4 - 7.0 x10E3/uL   Lymphocytes Absolute 1.3 0.7 - 3.1 x10E3/uL    Monocytes Absolute 0.5 0.1 - 0.9 x10E3/uL   EOS (ABSOLUTE) 0.0 0.0 - 0.4 x10E3/uL   Basophils Absolute 0.0 0.0 - 0.2 x10E3/uL   Immature Granulocytes 0 Not Estab. %   Immature Grans (Abs) 0.0 0.0 - 0.1 x10E3/uL   Narrative   Performed at:  [facility] [address] [place], [place]  [id number] Lab Director: Rush Farmer MD, Phone:  [phone]  Comprehensive metabolic panel     Status: Abnormal   Collection Time: 04/13/22  6:36 PM  Result Value Ref Range   Glucose 85 70 - 99 mg/dL   BUN 7 6 - 20 mg/dL   Creatinine, Ser 0.83 0.57 - 1.00 mg/dL   eGFR 98 >59 mL/min/1.73   BUN/Creatinine Ratio 8 (L) 9 - 23   Sodium 136 134 - 144 mmol/L   Potassium 4.2 3.5 - 5.2 mmol/L   Chloride 102 96 - 106 mmol/L   CO2 22 20 - 29 mmol/L   Calcium 9.3 8.7 - 10.2 mg/dL   Total Protein 7.1 6.0 - 8.5 g/dL   Albumin 4.0 4.0 - 5.0 g/dL   Globulin, Total 3.1 1.5 - 4.5 g/dL   Albumin/Globulin Ratio 1.3 1.2 - 2.2   Bilirubin Total <0.2 0.0 - 1.2 mg/dL   Alkaline Phosphatase 48 44 - 121 IU/L   AST 24 0 - 40 IU/L   ALT 15 0 - 32 IU/L   Narrative   Performed at:  [facility] [address], [place], [place]  [id number] Lab Director: Rush Farmer MD, Phone:  [phone]    Assessment and Plan :   PDMP not reviewed this encounter.  1. Pain and swelling of left lower leg     At this stage, recommended that she take Tylenol, elevate her leg.  Will pursue an outpatient ultrasound to rule out DVT.  Discussed avoiding empiric treatment for cellulitis given that she recently finished 2 weeks worth of doxycycline and metronidazole for treatment of H. pylori infection as prescribed by her gastroenterologist.  Patient is in agreement.  Labs pending, will follow-up soon as possible tomorrow.   Jaynee Eagles, [place] 04/14/22 [phone]

## 2022-04-13 NOTE — ED Triage Notes (Addendum)
Pt c/o left ankle swelling, tenderness and warmth to touch. The patient does not recall what caused the pain.    There is visible swelling and redness to the LLE, the patient reports pain/ tenderness on palpation. No pitting noted.  Started: yesterday  Home interventions: none

## 2022-04-14 ENCOUNTER — Telehealth: Payer: Self-pay | Admitting: Urgent Care

## 2022-04-14 ENCOUNTER — Other Ambulatory Visit (HOSPITAL_COMMUNITY): Payer: Self-pay | Admitting: Urgent Care

## 2022-04-14 ENCOUNTER — Ambulatory Visit (HOSPITAL_COMMUNITY)
Admission: RE | Admit: 2022-04-14 | Discharge: 2022-04-14 | Disposition: A | Discharge status: Home / Self Care | Payer: 59 | Source: Ambulatory Visit | Attending: Urgent Care | Admitting: Urgent Care

## 2022-04-14 DIAGNOSIS — R609 Edema, unspecified: Secondary | ICD-10-CM

## 2022-04-14 LAB — CBC WITH DIFFERENTIAL/PLATELET
Basophils Absolute: 0 10*3/uL (ref 0.0–0.2)
Basos: 1 %
EOS (ABSOLUTE): 0 10*3/uL (ref 0.0–0.4)
Eos: 1 %
Hematocrit: 37.2 % (ref 34.0–46.6)
Hemoglobin: 11.4 g/dL (ref 11.1–15.9)
Immature Grans (Abs): 0 10*3/uL (ref 0.0–0.1)
Immature Granulocytes: 0 %
Lymphocytes Absolute: 1.3 10*3/uL (ref 0.7–3.1)
Lymphs: 34 %
MCH: 24.6 pg — ABNORMAL LOW (ref 26.6–33.0)
MCHC: 30.6 g/dL — ABNORMAL LOW (ref 31.5–35.7)
MCV: 80 fL (ref 79–97)
Monocytes Absolute: 0.5 10*3/uL (ref 0.1–0.9)
Monocytes: 13 %
Neutrophils Absolute: 2 10*3/uL (ref 1.4–7.0)
Neutrophils: 51 %
Platelets: 239 10*3/uL (ref 150–450)
RBC: 4.63 x10E6/uL (ref 3.77–5.28)
RDW: 14.1 % (ref 11.7–15.4)
WBC: 3.8 10*3/uL (ref 3.4–10.8)

## 2022-04-14 LAB — COMPREHENSIVE METABOLIC PANEL
ALT: 15 IU/L (ref 0–32)
AST: 24 IU/L (ref 0–40)
Albumin/Globulin Ratio: 1.3 (ref 1.2–2.2)
Albumin: 4 g/dL (ref 4.0–5.0)
Alkaline Phosphatase: 48 IU/L (ref 44–121)
BUN/Creatinine Ratio: 8 — ABNORMAL LOW (ref 9–23)
BUN: 7 mg/dL (ref 6–20)
Bilirubin Total: <0.2 mg/dL (ref 0.0–1.2)
CO2: 22 mmol/L (ref 20–29)
Calcium: 9.3 mg/dL (ref 8.7–10.2)
Chloride: 102 mmol/L (ref 96–106)
Creatinine, Ser: 0.83 mg/dL (ref 0.57–1.00)
Globulin, Total: 3.1 g/dL (ref 1.5–4.5)
Glucose: 85 mg/dL (ref 70–99)
Potassium: 4.2 mmol/L (ref 3.5–5.2)
Sodium: 136 mmol/L (ref 134–144)
Total Protein: 7.1 g/dL (ref 6.0–8.5)
eGFR: 98 mL/min/{1.73_m2} (ref >59)

## 2022-04-14 MED ORDER — IBUPROFEN 600 MG PO TABS: 600.0000 mg | ORAL_TABLET | Freq: Four times a day (QID) | ORAL | 0 refills | Status: DC | PRN

## 2022-04-14 MED ORDER — CEPHALEXIN 500 MG PO CAPS: 500.0000 mg | ORAL_CAPSULE | Freq: Three times a day (TID) | ORAL | 0 refills | Status: DC

## 2022-04-14 NOTE — Telephone Encounter (Signed)
Blood work was unremarkable, ultrasound for DVT rule out was negative.  Recommended management for left lower leg cellulitis with Keflex, ibuprofen for pain and inflammation.  Patient was previously taking this without any issue despite her aspirin allergy.

## 2022-07-25 ENCOUNTER — Other Ambulatory Visit: Payer: Self-pay

## 2022-07-25 ENCOUNTER — Encounter (HOSPITAL_COMMUNITY): Payer: Self-pay | Admitting: *Deleted

## 2022-07-25 ENCOUNTER — Ambulatory Visit (HOSPITAL_COMMUNITY)
Admission: EM | Admit: 2022-07-25 | Discharge: 2022-07-25 | Disposition: A | Discharge status: Home / Self Care | Payer: 59 | Attending: Family Medicine | Admitting: Family Medicine

## 2022-07-25 DIAGNOSIS — N898 Other specified noninflammatory disorders of vagina: Secondary | ICD-10-CM | POA: Insufficient documentation

## 2022-07-25 MED ORDER — FLUCONAZOLE 150 MG PO TABS: 150.0000 mg | ORAL_TABLET | Freq: Once | ORAL | 0 refills | Status: AC

## 2022-07-25 NOTE — ED Triage Notes (Signed)
Pt reports she has a yeast infection with discharge. Pt ia also on her period.

## 2022-07-25 NOTE — ED Provider Notes (Signed)
[facility]    CSN: [id number] Arrival date & time: 07/25/22  [id number]      History   Chief Complaint Chief Complaint  Patient presents with   Vaginal Discharge    HPI Ellen Roberts is a 27 y.o. female.   Patient is here for vaginal discomfort and itching since yesterday.  She is having d/c but is also on her period so hard to tell.  She had a lot of mucous over weekend. No known std exposure.  No urinary symptoms.         Past Medical History:  Diagnosis Date   Anemia     There are no problems to display for this patient.   Past Surgical History:  Procedure Laterality Date   HERNIA REPAIR      OB History     Gravida  0   Para      Term      Preterm      AB      Living         SAB      IAB      Ectopic      Multiple      Live Births               Home Medications    Prior to Admission medications   Medication Sig Start Date End Date Taking? Authorizing Provider  acetaminophen (TYLENOL) 325 MG tablet Take 2 tablets (650 mg total) by mouth every 6 (six) hours as needed for moderate pain. 04/13/22   Jaynee Eagles, PA-C  cephALEXin (KEFLEX) 500 MG capsule Take 1 capsule (500 mg total) by mouth 3 (three) times daily. 04/14/22   Jaynee Eagles, PA-C  ibuprofen (ADVIL) 600 MG tablet Take 1 tablet (600 mg total) by mouth every 6 (six) hours as needed. 04/14/22   Jaynee Eagles, PA-C  lansoprazole (PREVACID) 30 MG capsule Take 1 capsule (30 mg total) by mouth 2 (two) times daily before a meal. 03/07/22   Sharyn Creamer, MD  ondansetron (ZOFRAN) 4 MG tablet Take 1 tablet (4 mg total) by mouth 3 (three) times daily as needed for nausea or vomiting. 03/07/22   Sharyn Creamer, MD  pantoprazole (PROTONIX) 40 MG tablet Take 40 mg by mouth daily. 03/11/22   Sharyn Creamer, MD    Family History Family History  Problem Relation Age of Onset   Diabetes Mother    Hypertension Mother    Colon cancer Neg Hx    Liver disease Neg Hx    Esophageal  cancer Neg Hx     Social History Social History   Tobacco Use   Smoking status: Never   Smokeless tobacco: Never  Vaping Use   Vaping Use: Never used  Substance Use Topics   Alcohol use: No   Drug use: No     Allergies   Peanut-containing drug products and Aspirin   Review of Systems Review of Systems  Constitutional: Negative.   HENT: Negative.    Respiratory: Negative.    Cardiovascular: Negative.   Gastrointestinal: Negative.   Genitourinary:  Positive for vaginal discharge.     Physical Exam Triage Vital Signs ED Triage Vitals  Enc Vitals Group     BP 07/25/22 0948 128/85     Pulse Rate 07/25/22 0948 78     Resp 07/25/22 0948 18     Temp 07/25/22 0948 98.5 F (36.9 C)     Temp src --  SpO2 07/25/22 0948 99 %     Weight --      Height --      Head Circumference --      Peak Flow --      Pain Score 07/25/22 0946 0     Pain Loc --      Pain Edu? --      Excl. in [place]? --    No data found.  Updated Vital Signs BP 128/85   Pulse 78   Temp 98.5 F (36.9 C)   Resp 18   LMP 07/25/2022   SpO2 99%   Visual Acuity Right Eye Distance:   Left Eye Distance:   Bilateral Distance:    Right Eye Near:   Left Eye Near:    Bilateral Near:     Physical Exam Constitutional:      Appearance: Normal appearance.  Cardiovascular:     Rate and Rhythm: Normal rate.  Pulmonary:     Effort: Pulmonary effort is normal.  Abdominal:     Palpations: Abdomen is soft.     Tenderness: There is abdominal tenderness.     Comments: Mild suprapubic tenderness  Neurological:     General: No focal deficit present.     Mental Status: She is alert.  Psychiatric:        Mood and Affect: Mood normal.      UC Treatments / Results  Labs (all labs ordered are listed, but only abnormal results are displayed) Labs Reviewed  CERVICOVAGINAL ANCILLARY ONLY    EKG   Radiology No results found.  Procedures Procedures (including critical care time)  Medications  Ordered in UC Medications - No data to display  Initial Impression / Assessment and Plan / UC Course  I have reviewed the triage vital signs and the nursing notes.  Pertinent labs & imaging results that were available during my care of the patient were reviewed by me and considered in my medical decision making (see chart for details).  Final Clinical Impressions(s) / UC Diagnoses   Final diagnoses:  Vaginal discharge     Discharge Instructions      You were seen today for vaginal discharge and irritation.  I have sent out diflucan x 1 dose for possible yeast.  Your vaginal swab will be resulted tomorrow and if further treatment is needed we will call to notify you.     ED Prescriptions     Medication Sig Dispense Auth. Provider   fluconazole (DIFLUCAN) 150 MG tablet Take 1 tablet (150 mg total) by mouth once for 1 dose. 1 tablet Rondel Oh, MD      PDMP not reviewed this encounter.   Rondel Oh, MD 07/25/22 1022

## 2022-07-25 NOTE — Discharge Instructions (Signed)
You were seen today for vaginal discharge and irritation.  I have sent out diflucan x 1 dose for possible yeast.  Your vaginal swab will be resulted tomorrow and if further treatment is needed we will call to notify you.

## 2022-07-26 LAB — CERVICOVAGINAL ANCILLARY ONLY
Bacterial Vaginitis (gardnerella): NEGATIVE
Candida Glabrata: NEGATIVE
Candida Vaginitis: POSITIVE — AB
Chlamydia: NEGATIVE
Comment: NEGATIVE
Comment: NEGATIVE
Comment: NEGATIVE
Comment: NEGATIVE
Comment: NEGATIVE
Comment: NORMAL
Neisseria Gonorrhea: NEGATIVE
Trichomonas: NEGATIVE

## 2022-07-28 ENCOUNTER — Telehealth (HOSPITAL_COMMUNITY): Payer: Self-pay | Admitting: Emergency Medicine

## 2022-07-28 MED ORDER — FLUCONAZOLE 150 MG PO TABS: 150.0000 mg | ORAL_TABLET | Freq: Once | ORAL | 0 refills | Status: AC

## 2022-08-17 ENCOUNTER — Ambulatory Visit
Admission: RE | Admit: 2022-08-17 | Discharge: 2022-08-17 | Disposition: A | Discharge status: Home / Self Care | Payer: 59 | Source: Ambulatory Visit | Attending: Urgent Care | Admitting: Urgent Care

## 2022-08-17 VITALS — BP 108/71 | HR 81 | Temp 98.7°F | Resp 16

## 2022-08-17 DIAGNOSIS — S80212A Abrasion, left knee, initial encounter: Secondary | ICD-10-CM | POA: Diagnosis not present

## 2022-08-17 DIAGNOSIS — K529 Noninfective gastroenteritis and colitis, unspecified: Secondary | ICD-10-CM | POA: Diagnosis not present

## 2022-08-17 DIAGNOSIS — R110 Nausea: Secondary | ICD-10-CM | POA: Diagnosis not present

## 2022-08-17 MED ORDER — BACITRACIN ZINC 500 UNIT/GM EX OINT: 1.0000 | TOPICAL_OINTMENT | Freq: Two times a day (BID) | CUTANEOUS | 0 refills | Status: DC

## 2022-08-17 MED ORDER — FAMOTIDINE 20 MG PO TABS: 20.0000 mg | ORAL_TABLET | Freq: Two times a day (BID) | ORAL | 0 refills | Status: DC

## 2022-08-17 MED ORDER — ONDANSETRON 8 MG PO TBDP: 8.0000 mg | ORAL_TABLET | Freq: Three times a day (TID) | ORAL | 0 refills | Status: DC | PRN

## 2022-08-17 NOTE — ED Provider Notes (Signed)
[facility]  Note:  This document was prepared using Conservation officer, historic buildings and may include unintentional dictation errors.  MRN: [id number] DOB: 11-16-1995  Subjective:   Ellen Roberts is a 27 y.o. female presenting for wound check to the left knee.  Patient states that she suffered this wound from broken glass.  Denies fever, drainage pus or bleeding.  She is also had persistent nausea without vomiting today.  Has a history of H. pylori infection.  Denies fever, hematemesis, bloody stools, constipation, abdominal pain.  Has previously taken acid reflux medicine.  She does have a gastroenterologist but has not been able to follow-up with them.  She would like recommendations for new PCP as she is not able to get in with them either.  No current facility-administered medications for this encounter.  Current Outpatient Medications:    acetaminophen (TYLENOL) 325 MG tablet, Take 2 tablets (650 mg total) by mouth every 6 (six) hours as needed for moderate pain., Disp: 30 tablet, Rfl: 0   ibuprofen (ADVIL) 600 MG tablet, Take 1 tablet (600 mg total) by mouth every 6 (six) hours as needed., Disp: 30 tablet, Rfl: 0   lansoprazole (PREVACID) 30 MG capsule, Take 1 capsule (30 mg total) by mouth 2 (two) times daily before a meal., Disp: 60 capsule, Rfl: 2   ondansetron (ZOFRAN) 4 MG tablet, Take 1 tablet (4 mg total) by mouth 3 (three) times daily as needed for nausea or vomiting., Disp: 30 tablet, Rfl: 2   pantoprazole (PROTONIX) 40 MG tablet, Take 40 mg by mouth daily., Disp: , Rfl:    Allergies  Allergen Reactions   Peanut-Containing Drug Products     Ance    Aspirin Itching and Rash    Stomach pains    Past Medical History:  Diagnosis Date   Anemia      Past Surgical History:  Procedure Laterality Date   HERNIA REPAIR      Family History  Problem Relation Age of Onset   Diabetes Mother    Hypertension Mother    Colon cancer Neg Hx    Liver  disease Neg Hx    Esophageal cancer Neg Hx     Social History   Tobacco Use   Smoking status: Never   Smokeless tobacco: Never  Vaping Use   Vaping Use: Never used  Substance Use Topics   Alcohol use: No   Drug use: No    ROS   Objective:   Vitals: BP 108/71 (BP Location: Right Arm)   Pulse 81   Temp 98.7 F (37.1 C) (Oral)   Resp 16   LMP 07/25/2022 (Approximate)   SpO2 98%   Physical Exam Constitutional:      General: She is not in acute distress.    Appearance: Normal appearance. She is well-developed. She is not ill-appearing, toxic-appearing or diaphoretic.  HENT:     Head: Normocephalic and atraumatic.     Nose: Nose normal.     Mouth/Throat:     Mouth: Mucous membranes are moist.  Eyes:     General: No scleral icterus.       Right eye: No discharge.        Left eye: No discharge.     Extraocular Movements: Extraocular movements intact.     Conjunctiva/sclera: Conjunctivae normal.  Cardiovascular:     Rate and Rhythm: Normal rate.  Pulmonary:     Effort: Pulmonary effort is normal.  Abdominal:     General:  Bowel sounds are normal. There is no distension.     Palpations: Abdomen is soft. There is no mass.     Tenderness: There is abdominal tenderness in the right lower quadrant, periumbilical area, suprapubic area and left lower quadrant. There is no right CVA tenderness, left CVA tenderness, guarding or rebound.  Musculoskeletal:       Legs:  Skin:    General: Skin is warm and dry.  Neurological:     General: No focal deficit present.     Mental Status: She is alert and oriented to person, place, and time.  Psychiatric:        Mood and Affect: Mood normal.        Behavior: Behavior normal.        Thought Content: Thought content normal.        Judgment: Judgment normal.    Dressing applied to the wound.  Assessment and Plan :   PDMP not reviewed this encounter.  1. Abrasion of left knee, initial encounter   2. Nausea without vomiting    3. Gastroenteritis    Recommended conservative management for the abrasion as there are no signs of secondary infection.  Wound care discussed.  Will manage for possible gastroenteritis, acid reflux, but discussed the possibility of recurrent/persistent H. pylori infection.  Recommended follow-up with a gastroenterologist.  In the meantime use supportive care and famotidine and Zofran. Counseled patient on potential for adverse effects with medications prescribed/recommended today, ER and return-to-clinic precautions discussed, patient verbalized understanding.    Wallis Bamberg, [place] 08/18/22 [phone]

## 2022-08-17 NOTE — Discharge Instructions (Addendum)
Change your dressing 2-3 times daily. Every time you change your dressing, clean the wound gently with warm water and Dial antibacterial soap. Pat the wound dry, let it breathe for roughly an hour before covering it back up. When you reapply a dressing, apply Bacitracin ointment to the wound, then cover with non-stick/non-adherent gauze.  After 5-7 days, the wound should scab over nicely and then you don't have to continue doing dressings.   Make sure you push fluids drinking mostly water but mix it with Gatorade.  Try to eat light meals including soups, broths and soft foods, fruits.  You may use Zofran for your nausea and vomiting once every 8 hours.  Please return to the clinic if symptoms worsen or you start having severe abdominal pain not helped by taking Tylenol or start having bloody stools or blood in the vomit.

## 2022-08-17 NOTE — ED Triage Notes (Signed)
Pt states nausea and vomiting today.  Also has a wound on her left knee that she wants someone to look at.

## 2022-08-18 ENCOUNTER — Telehealth: Payer: Self-pay

## 2022-08-18 MED ORDER — PANTOPRAZOLE SODIUM 40 MG PO TBEC: 40.0000 mg | DELAYED_RELEASE_TABLET | Freq: Every day | ORAL | 0 refills | Status: DC

## 2022-08-18 NOTE — Telephone Encounter (Signed)
Sent 30 day to pof pt need f/u appt.Marland KitchenRaechel Chute

## 2022-08-18 NOTE — Addendum Note (Signed)
Addended by: Marinus Maw on: 08/18/2022 04:40 PM   Modules accepted: Orders

## 2022-08-18 NOTE — Telephone Encounter (Signed)
Pt has stated she is needing a rx refill for her pantoprazole (PROTONIX) 40 MG tablet.   Please send to [facility] on [address]

## 2022-08-23 ENCOUNTER — Ambulatory Visit: Payer: Self-pay | Admitting: Family Medicine

## 2022-09-28 ENCOUNTER — Ambulatory Visit: Payer: Self-pay | Admitting: Family Medicine

## 2022-09-30 ENCOUNTER — Ambulatory Visit: Payer: Self-pay

## 2022-09-30 ENCOUNTER — Ambulatory Visit
Admission: EM | Admit: 2022-09-30 | Discharge: 2022-09-30 | Disposition: A | Discharge status: Home / Self Care | Payer: 59 | Attending: Nurse Practitioner | Admitting: Nurse Practitioner

## 2022-09-30 DIAGNOSIS — R2242 Localized swelling, mass and lump, left lower limb: Secondary | ICD-10-CM | POA: Diagnosis not present

## 2022-09-30 MED ORDER — HYDROCHLOROTHIAZIDE 12.5 MG PO CAPS: 12.5000 mg | ORAL_CAPSULE | Freq: Every day | ORAL | 0 refills | Status: DC

## 2022-09-30 NOTE — Discharge Instructions (Addendum)
Persistent Left lower extremity ultrasound has been ordered to rule out DVT, deep vein thrombosis. Number to schedule [phone]. They may also call you to schedule this appointment.  Due to previous ultrasound order for December 2023 being canceled.   You have been prescribed hydrochlorothiazide 12.5 mg 1 tablet daily over the next 3 days. The recommendation is also for you to wear the compression hose/socks when you are sitting for long.  You have been provided with some patient education on deep vein thrombosis and edema. It is important that you follow-up with your primary care provider for the referral that she spoke about while in clinic.

## 2022-09-30 NOTE — ED Triage Notes (Addendum)
Pt presents to UC w/ c/o left foot pain and swelling x1 week. Pt states this pain has been going on and off for more than 8 years. Denies direct injury to foot but states she fell a week ago and has pain that started in her right knee.

## 2022-09-30 NOTE — ED Provider Notes (Signed)
[facility]    CSN: [id number] Arrival date & time: 09/30/22  1104      History   Chief Complaint No chief complaint on file.   HPI Ellen Roberts is a 27 y.o. female.   HPI  She is in today for complaint of left lower leg swelling.  She reports that she has had history of this for greater than 8 years.  She was seen in December here in urgent care and treated for cellulitis but was also to be evaluated for DVT.  The Doppler was canceled and the reasoning is not clear.  She reports that she had an ultrasound years ago however there is no results noted in the chart.  She denies any shortness of breath, chest pain.  She reports that she does work from home and she sits at the computer.  She endorses having compression hose however does not wear them.  She reports that she does have an appointment with her primary care provider next week.  She was supposed to been referred to podiatry sometime ago reports appointment was not completed.  She reports that she is having busy weekend and does not want to have to deal with the swelling. Past Medical History:  Diagnosis Date   Anemia     There are no problems to display for this patient.   Past Surgical History:  Procedure Laterality Date   HERNIA REPAIR      OB History     Gravida  0   Para      Term      Preterm      AB      Living         SAB      IAB      Ectopic      Multiple      Live Births               Home Medications    Prior to Admission medications   Medication Sig Start Date End Date Taking? Authorizing Provider  hydrochlorothiazide (MICROZIDE) 12.5 MG capsule Take 1 capsule (12.5 mg total) by mouth daily. 09/30/22  Yes Barbette Merino, NP  acetaminophen (TYLENOL) 325 MG tablet Take 2 tablets (650 mg total) by mouth every 6 (six) hours as needed for moderate pain. 04/13/22   Wallis Bamberg, PA-C  bacitracin ointment Apply 1 Application topically 2 (two) times daily. 08/17/22   Wallis Bamberg, PA-C  famotidine (PEPCID) 20 MG tablet Take 1 tablet (20 mg total) by mouth 2 (two) times daily. 08/17/22   Wallis Bamberg, PA-C  ibuprofen (ADVIL) 600 MG tablet Take 1 tablet (600 mg total) by mouth every 6 (six) hours as needed. 04/14/22   Wallis Bamberg, PA-C  lansoprazole (PREVACID) 30 MG capsule Take 1 capsule (30 mg total) by mouth 2 (two) times daily before a meal. 03/07/22   Imogene Burn, MD  ondansetron (ZOFRAN-ODT) 8 MG disintegrating tablet Take 1 tablet (8 mg total) by mouth every 8 (eight) hours as needed for nausea or vomiting. 08/17/22   Wallis Bamberg, PA-C  pantoprazole (PROTONIX) 40 MG tablet Take 1 tablet (40 mg total) by mouth daily. 08/18/22   Avanell Shackleton, NP-C    Family History Family History  Problem Relation Age of Onset   Diabetes Mother    Hypertension Mother    Colon cancer Neg Hx    Liver disease Neg Hx    Esophageal cancer Neg Hx  Social History Social History   Tobacco Use   Smoking status: Never   Smokeless tobacco: Never  Vaping Use   Vaping Use: Never used  Substance Use Topics   Alcohol use: No   Drug use: No     Allergies   Peanut-containing drug products and Aspirin   Review of Systems Review of Systems   Physical Exam Triage Vital Signs ED Triage Vitals [09/30/22 1112]  Enc Vitals Group     BP 105/65     Pulse Rate 71     Resp 16     Temp 98 F (36.7 C)     Temp Source Oral     SpO2 98 %     Weight      Height      Head Circumference      Peak Flow      Pain Score 5     Pain Loc      Pain Edu?      Excl. in [place]?    No data found.  Updated Vital Signs BP 105/65 (BP Location: Right Arm)   Pulse 71   Temp 98 F (36.7 C) (Oral)   Resp 16   LMP 09/24/2022 (Exact Date)   SpO2 98%   Visual Acuity Right Eye Distance:   Left Eye Distance:   Bilateral Distance:    Right Eye Near:   Left Eye Near:    Bilateral Near:     Physical Exam Constitutional:      General: She is not in acute distress.     Appearance: She is obese.  HENT:     Head: Normocephalic.  Cardiovascular:     Rate and Rhythm: Normal rate.     Pulses: Normal pulses.  Pulmonary:     Effort: Pulmonary effort is normal.  Musculoskeletal:        General: Swelling present.     Left lower leg: Edema (non pitting) present.     Comments: Homans negative  Skin:    General: Skin is warm.     Capillary Refill: Capillary refill takes less than 2 seconds.  Neurological:     Mental Status: She is alert and oriented to person, place, and time.      UC Treatments / Results  Labs (all labs ordered are listed, but only abnormal results are displayed) Labs Reviewed - No data to display  EKG   Radiology No results found.  Procedures Procedures (including critical care time)  Medications Ordered in UC Medications - No data to display  Initial Impression / Assessment and Plan / UC Course  I have reviewed the triage vital signs and the nursing notes.  Pertinent labs & imaging results that were available during my care of the patient were reviewed by me and considered in my medical decision making (see chart for details).     LLE edema Final Clinical Impressions(s) / UC Diagnoses   Final diagnoses:  Localized swelling of left lower leg     Discharge Instructions      Persistent Left lower extremity ultrasound has been ordered to rule out DVT, deep vein thrombosis. Number to schedule [phone]. They may also call you to schedule this appointment.  Due to previous ultrasound order for December 2023 being canceled.   You have been prescribed hydrochlorothiazide 12.5 mg 1 tablet daily over the next 3 days. The recommendation is also for you to wear the compression hose/socks when you are sitting for long.  You have been provided  with some patient education on deep vein thrombosis and edema. It is important that you follow-up with your primary care provider for the referral that she spoke about while in  clinic.     ED Prescriptions     Medication Sig Dispense Auth. Provider   hydrochlorothiazide (MICROZIDE) 12.5 MG capsule Take 1 capsule (12.5 mg total) by mouth daily. 3 capsule Barbette Merino, NP      PDMP not reviewed this encounter.   Barbette Merino, NP 09/30/22 1224

## 2022-10-06 ENCOUNTER — Ambulatory Visit (INDEPENDENT_AMBULATORY_CARE_PROVIDER_SITE_OTHER): Payer: 59 | Admitting: Family Medicine

## 2022-10-06 VITALS — BP 110/76 | HR 69 | Temp 97.6°F | Ht 63.0 in | Wt 180.0 lb

## 2022-10-06 DIAGNOSIS — M25472 Effusion, left ankle: Secondary | ICD-10-CM

## 2022-10-06 DIAGNOSIS — Z6831 Body mass index (BMI) 31.0-31.9, adult: Secondary | ICD-10-CM | POA: Diagnosis not present

## 2022-10-06 DIAGNOSIS — M25471 Effusion, right ankle: Secondary | ICD-10-CM | POA: Diagnosis not present

## 2022-10-06 DIAGNOSIS — D649 Anemia, unspecified: Secondary | ICD-10-CM | POA: Diagnosis not present

## 2022-10-06 DIAGNOSIS — E669 Obesity, unspecified: Secondary | ICD-10-CM

## 2022-10-06 LAB — CBC WITH DIFFERENTIAL/PLATELET
Basophils Absolute: 0 10*3/uL (ref 0.0–0.1)
Basophils Relative: 1 % (ref 0.0–3.0)
Eosinophils Absolute: 0 10*3/uL (ref 0.0–0.7)
Eosinophils Relative: 1.5 % (ref 0.0–5.0)
HCT: 32.9 % — ABNORMAL LOW (ref 36.0–46.0)
Hemoglobin: 10.5 g/dL — ABNORMAL LOW (ref 12.0–15.0)
Lymphocytes Relative: 34.9 % (ref 12.0–46.0)
Lymphs Abs: 1.1 10*3/uL (ref 0.7–4.0)
MCHC: 32 g/dL (ref 30.0–36.0)
MCV: 78.3 fl (ref 78.0–100.0)
Monocytes Absolute: 0.4 10*3/uL (ref 0.1–1.0)
Monocytes Relative: 12.6 % — ABNORMAL HIGH (ref 3.0–12.0)
Neutro Abs: 1.6 10*3/uL (ref 1.4–7.7)
Neutrophils Relative %: 50 % (ref 43.0–77.0)
Platelets: 280 10*3/uL (ref 150.0–400.0)
RBC: 4.21 Mil/uL (ref 3.87–5.11)
RDW: 14.2 % (ref 11.5–15.5)
WBC: 3.3 10*3/uL — ABNORMAL LOW (ref 4.0–10.5)

## 2022-10-06 LAB — COMPREHENSIVE METABOLIC PANEL
ALT: 21 U/L (ref 0–35)
AST: 25 U/L (ref 0–37)
Albumin: 3.9 g/dL (ref 3.5–5.2)
Alkaline Phosphatase: 48 U/L (ref 39–117)
BUN: 12 mg/dL (ref 6–23)
CO2: 28 mEq/L (ref 19–32)
Calcium: 8.9 mg/dL (ref 8.4–10.5)
Chloride: 104 mEq/L (ref 96–112)
Creatinine, Ser: 0.9 mg/dL (ref 0.40–1.20)
GFR: 86.58 mL/min (ref >60.00)
Glucose, Bld: 80 mg/dL (ref 70–99)
Potassium: 4.2 mEq/L (ref 3.5–5.1)
Sodium: 137 mEq/L (ref 135–145)
Total Bilirubin: 0.4 mg/dL (ref 0.2–1.2)
Total Protein: 7.1 g/dL (ref 6.0–8.3)

## 2022-10-06 LAB — TSH: TSH: 2.16 u[IU]/mL (ref 0.35–5.50)

## 2022-10-06 MED ORDER — HYDROCHLOROTHIAZIDE 12.5 MG PO CAPS
ORAL_CAPSULE | ORAL | 0 refills | Status: DC
Start: 2022-10-06 — End: 2023-01-03

## 2022-10-06 NOTE — Patient Instructions (Addendum)
Please go downstairs for labs before you leave.  As discussed, make sure you do not sit for more than 1 hour.  You should get up and walk every hour during the day while you are at your desk.  I also recommend elevating your legs when sitting for long periods when possible.    You may wear compression socks to help with fluid return.  Take the hydrochlorothiazide 12.5 mg no more than once daily as needed for ankle edema. As a reminder, this medication is not safe if you were to become pregnant.  Follow-up if you have any new or worsening symptoms.    Edema  Edema is when you have too much fluid in your body or under your skin. Edema may make your legs, feet, and ankles swell. Swelling often happens in looser tissues, such as around your eyes. This is a common condition. It gets more common as you get older. There are many possible causes of edema. These include: Eating too much salt (sodium). Being on your feet or sitting for a long time. Certain medical conditions, such as: Pregnancy. Heart failure. Liver disease. Kidney disease. Cancer. Hot weather may make edema worse. Edema is usually painless. Your skin may look swollen or shiny. Follow these instructions at home: Medicines Take over-the-counter and prescription medicines only as told by your doctor. Your doctor may prescribe a medicine to help your body get rid of extra water (diuretic). Take this medicine if you are told to take it. Eating and drinking Eat a low-salt (low-sodium) diet as told by your doctor. Sometimes, eating less salt may reduce swelling. Depending on the cause of your swelling, you may need to limit how much fluid you drink (fluid restriction). General instructions Raise the injured area above the level of your heart while you are sitting or lying down. Do not sit still or stand for a long time. Do not wear tight clothes. Do not wear garters on your upper legs. Exercise your legs. This can help the  swelling go down. Wear compression stockings as told by your doctor. It is important that these are the right size. These should be prescribed by your doctor to prevent possible injuries. If elastic bandages or wraps are recommended, use them as told by your doctor. Contact a doctor if: Treatment is not working. You have heart, liver, or kidney disease and have symptoms of edema. You have sudden and unexplained weight gain. Get help right away if: You have shortness of breath or chest pain. You cannot breathe when you lie down. You have pain, redness, or warmth in the swollen areas. You have heart, liver, or kidney disease and get edema all of a sudden. You have a fever and your symptoms get worse all of a sudden. These symptoms may be an emergency. Get help right away. Call 911. Do not wait to see if the symptoms will go away. Do not drive yourself to the hospital. Summary Edema is when you have too much fluid in your body or under your skin. Edema may make your legs, feet, and ankles swell. Swelling often happens in looser tissues, such as around your eyes. Raise the injured area above the level of your heart while you are sitting or lying down. Follow your doctor's instructions about diet and how much fluid you can drink. This information is not intended to replace advice given to you by your health care provider. Make sure you discuss any questions you have with your health care provider.  Document Revised: 12/21/2020 Document Reviewed: 12/21/2020 Elsevier Patient Education  2024 [employer].

## 2022-10-06 NOTE — Progress Notes (Signed)
Subjective:     Patient ID: Ellen Roberts, female    DOB: [birth year], 27 y.o.   MRN: [id number]  Chief Complaint  Patient presents with   Joint Swelling    Swelling has not been controlled as she was not able to get medication urgent care sent in (said pharmacy had to call UC for rx clarification). Reports swelling has been going on for a while just recently flared up    HPI  Discussed the use of AI scribe software for clinical note transcription with the patient, who gave verbal consent to proceed.  History of Present Illness         C/o intermittent bilateral leg edema. States this has been an issue for the past 10 years.  No pain. Left ankle more than right.   LMP: 09/25/2022 Is not sexually active.      Health Maintenance Due  Topic Date Due   COVID-19 Vaccine (1) Never done    Past Medical History:  Diagnosis Date   Anemia     Past Surgical History:  Procedure Laterality Date   HERNIA REPAIR      Family History  Problem Relation Age of Onset   Diabetes Mother    Hypertension Mother    Colon cancer Neg Hx    Liver disease Neg Hx    Esophageal cancer Neg Hx     Social History   Socioeconomic History   Marital status: Married    Spouse name: Not on file   Number of children: 0   Years of education: Not on file   Highest education level: Master's degree (e.g., MA, MS, MEng, MEd, MSW, MBA)  Occupational History   Occupation: full time student  Tobacco Use   Smoking status: Never   Smokeless tobacco: Never  Vaping Use   Vaping Use: Never used  Substance and Sexual Activity   Alcohol use: No   Drug use: No   Sexual activity: Not Currently    Birth control/protection: None  Other Topics Concern   Not on file  Social History Narrative   Not on file   Social Determinants of Health   Financial Resource Strain: Medium Risk (10/06/2022)   Overall Financial Resource Strain (CARDIA)    Difficulty of Paying Living Expenses: Somewhat hard  Food  Insecurity: Food Insecurity Present (10/06/2022)   Hunger Vital Sign    Worried About Running Out of Food in the Last Year: Patient declined    Ran Out of Food in the Last Year: Sometimes true  Transportation Needs: No Transportation Needs (10/06/2022)   PRAPARE - Administrator, Civil Service (Medical): No    Lack of Transportation (Non-Medical): No  Physical Activity: Insufficiently Active (10/06/2022)   Exercise Vital Sign    Days of Exercise per Week: 1 day    Minutes of Exercise per Session: 10 min  Stress: No Stress Concern Present (10/06/2022)   [employer] of Occupational Health - Occupational Stress Questionnaire    Feeling of Stress : Only a little  Social Connections: Moderately Integrated (10/06/2022)   Social Connection and Isolation Panel [NHANES]    Frequency of Communication with Friends and Family: More than three times a week    Frequency of Social Gatherings with Friends and Family: Once a week    Attends Religious Services: More than 4 times per year    Active Member of [employer] or Organizations: No    Attends Banker Meetings: Not on file  Marital Status: Married  Catering manager Violence: Not on file    Outpatient Medications Prior to Visit  Medication Sig Dispense Refill   lansoprazole (PREVACID) 30 MG capsule Take 1 capsule (30 mg total) by mouth 2 (two) times daily before a meal. (Patient not taking: Reported on 10/06/2022) 60 capsule 2   acetaminophen (TYLENOL) 325 MG tablet Take 2 tablets (650 mg total) by mouth every 6 (six) hours as needed for moderate pain. (Patient not taking: Reported on 10/06/2022) 30 tablet 0   bacitracin ointment Apply 1 Application topically 2 (two) times daily. (Patient not taking: Reported on 10/06/2022) 120 g 0   famotidine (PEPCID) 20 MG tablet Take 1 tablet (20 mg total) by mouth 2 (two) times daily. (Patient not taking: Reported on 10/06/2022) 30 tablet 0   hydrochlorothiazide (MICROZIDE) 12.5 MG capsule Take 1  capsule (12.5 mg total) by mouth daily. (Patient not taking: Reported on 10/06/2022) 3 capsule 0   ibuprofen (ADVIL) 600 MG tablet Take 1 tablet (600 mg total) by mouth every 6 (six) hours as needed. (Patient not taking: Reported on 10/06/2022) 30 tablet 0   ondansetron (ZOFRAN-ODT) 8 MG disintegrating tablet Take 1 tablet (8 mg total) by mouth every 8 (eight) hours as needed for nausea or vomiting. (Patient not taking: Reported on 10/06/2022) 20 tablet 0   pantoprazole (PROTONIX) 40 MG tablet Take 1 tablet (40 mg total) by mouth daily. (Patient not taking: Reported on 10/06/2022) 90 tablet 0   No facility-administered medications prior to visit.    Allergies  Allergen Reactions   Peanut-Containing Drug Products     Ance    Aspirin Itching and Rash    Stomach pains    Review of Systems  Constitutional:  Negative for chills and fever.  Respiratory:  Negative for shortness of breath.   Cardiovascular:  Negative for chest pain and palpitations.  Gastrointestinal:  Negative for abdominal pain, constipation, diarrhea, nausea and vomiting.  Genitourinary:  Negative for dysuria, frequency and urgency.  Neurological:  Negative for dizziness, sensory change and focal weakness.       Objective:    Physical Exam Constitutional:      General: She is not in acute distress.    Appearance: She is not ill-appearing.  Eyes:     Extraocular Movements: Extraocular movements intact.     Conjunctiva/sclera: Conjunctivae normal.  Cardiovascular:     Rate and Rhythm: Normal rate.  Pulmonary:     Effort: Pulmonary effort is normal.  Musculoskeletal:     Cervical back: Normal range of motion and neck supple.     Right lower leg: No edema.     Left lower leg: No edema.     Comments: Bilateral calves soft, non tender, no erythema or edema.   Skin:    General: Skin is warm and dry.  Neurological:     General: No focal deficit present.     Mental Status: She is alert and oriented to person, place, and  time.     Cranial Nerves: No cranial nerve deficit.     Motor: No weakness.     Gait: Gait normal.  Psychiatric:        Mood and Affect: Mood normal.        Behavior: Behavior normal.        Thought Content: Thought content normal.      BP 110/76 (BP Location: Left Arm, Patient Position: Sitting, Cuff Size: Large)   Pulse 69   Temp 97.6 F (36.4  C) (Temporal)   Ht 5\' 3"  (1.6 m)   Wt 180 lb (81.6 kg)   LMP 09/24/2022 (Exact Date)   SpO2 100%   BMI 31.89 kg/m  Wt Readings from Last 3 Encounters:  10/06/22 180 lb (81.6 kg)  03/10/22 189 lb (85.7 kg)  03/07/22 191 lb 9.6 oz (86.9 kg)       Assessment & Plan:   Problem List Items Addressed This Visit   None Visit Diagnoses     Ankle edema, bilateral    -  Primary   Relevant Medications   hydrochlorothiazide (MICROZIDE) 12.5 MG capsule   Other Relevant Orders   CBC with Differential/Platelet (Completed)   Comprehensive metabolic panel (Completed)   TSH (Completed)   Anemia, unspecified type       Relevant Orders   CBC with Differential/Platelet (Completed)   Obesity (BMI 30.0-34.9)       Relevant Orders   TSH (Completed)      Reviewed recent notes from UC. No sign of DVT or obstruction. No infection.  Recommend HCTZ prn edema. Discussed getting up every hour, elevating legs when sitting, compression socks. Follow up if worsening.  Check labs and follow up   I have discontinued Ellena Caserta's acetaminophen, ibuprofen, bacitracin, famotidine, ondansetron, pantoprazole, and hydrochlorothiazide. I am also having her start on hydrochlorothiazide. Additionally, I am having her maintain her lansoprazole.  Meds ordered this encounter  Medications   hydrochlorothiazide (MICROZIDE) 12.5 MG capsule    Sig: Take 1 capsule (12.5 mg total) by mouth daily as needed for ankle edema.    Dispense:  30 capsule    Refill:  0    Order Specific Question:   Supervising Provider    Answer:   Hillard Danker A [[id number]]

## 2022-10-10 DIAGNOSIS — M5134 Other intervertebral disc degeneration, thoracic region: Secondary | ICD-10-CM | POA: Diagnosis not present

## 2022-10-10 DIAGNOSIS — M9904 Segmental and somatic dysfunction of sacral region: Secondary | ICD-10-CM | POA: Diagnosis not present

## 2022-10-10 DIAGNOSIS — M5137 Other intervertebral disc degeneration, lumbosacral region: Secondary | ICD-10-CM | POA: Diagnosis not present

## 2022-10-10 DIAGNOSIS — M9902 Segmental and somatic dysfunction of thoracic region: Secondary | ICD-10-CM | POA: Diagnosis not present

## 2022-10-10 DIAGNOSIS — M5386 Other specified dorsopathies, lumbar region: Secondary | ICD-10-CM | POA: Diagnosis not present

## 2022-10-10 DIAGNOSIS — M62830 Muscle spasm of back: Secondary | ICD-10-CM | POA: Diagnosis not present

## 2022-10-10 DIAGNOSIS — M9903 Segmental and somatic dysfunction of lumbar region: Secondary | ICD-10-CM | POA: Diagnosis not present

## 2022-10-10 DIAGNOSIS — M9905 Segmental and somatic dysfunction of pelvic region: Secondary | ICD-10-CM | POA: Diagnosis not present

## 2022-10-10 DIAGNOSIS — M9901 Segmental and somatic dysfunction of cervical region: Secondary | ICD-10-CM | POA: Diagnosis not present

## 2022-10-14 DIAGNOSIS — M9905 Segmental and somatic dysfunction of pelvic region: Secondary | ICD-10-CM | POA: Diagnosis not present

## 2022-10-14 DIAGNOSIS — M62830 Muscle spasm of back: Secondary | ICD-10-CM | POA: Diagnosis not present

## 2022-10-14 DIAGNOSIS — M5137 Other intervertebral disc degeneration, lumbosacral region: Secondary | ICD-10-CM | POA: Diagnosis not present

## 2022-10-14 DIAGNOSIS — M5386 Other specified dorsopathies, lumbar region: Secondary | ICD-10-CM | POA: Diagnosis not present

## 2022-10-14 DIAGNOSIS — M9902 Segmental and somatic dysfunction of thoracic region: Secondary | ICD-10-CM | POA: Diagnosis not present

## 2022-10-14 DIAGNOSIS — M5134 Other intervertebral disc degeneration, thoracic region: Secondary | ICD-10-CM | POA: Diagnosis not present

## 2022-10-14 DIAGNOSIS — M9901 Segmental and somatic dysfunction of cervical region: Secondary | ICD-10-CM | POA: Diagnosis not present

## 2022-10-14 DIAGNOSIS — M9904 Segmental and somatic dysfunction of sacral region: Secondary | ICD-10-CM | POA: Diagnosis not present

## 2022-10-14 DIAGNOSIS — M9903 Segmental and somatic dysfunction of lumbar region: Secondary | ICD-10-CM | POA: Diagnosis not present

## 2022-10-17 DIAGNOSIS — M5137 Other intervertebral disc degeneration, lumbosacral region: Secondary | ICD-10-CM | POA: Diagnosis not present

## 2022-10-17 DIAGNOSIS — M9902 Segmental and somatic dysfunction of thoracic region: Secondary | ICD-10-CM | POA: Diagnosis not present

## 2022-10-17 DIAGNOSIS — M5386 Other specified dorsopathies, lumbar region: Secondary | ICD-10-CM | POA: Diagnosis not present

## 2022-10-17 DIAGNOSIS — M9904 Segmental and somatic dysfunction of sacral region: Secondary | ICD-10-CM | POA: Diagnosis not present

## 2022-10-17 DIAGNOSIS — M9905 Segmental and somatic dysfunction of pelvic region: Secondary | ICD-10-CM | POA: Diagnosis not present

## 2022-10-17 DIAGNOSIS — M9903 Segmental and somatic dysfunction of lumbar region: Secondary | ICD-10-CM | POA: Diagnosis not present

## 2022-10-17 DIAGNOSIS — M5134 Other intervertebral disc degeneration, thoracic region: Secondary | ICD-10-CM | POA: Diagnosis not present

## 2022-10-17 DIAGNOSIS — M9901 Segmental and somatic dysfunction of cervical region: Secondary | ICD-10-CM | POA: Diagnosis not present

## 2022-10-17 DIAGNOSIS — M62830 Muscle spasm of back: Secondary | ICD-10-CM | POA: Diagnosis not present

## 2022-10-21 DIAGNOSIS — M5386 Other specified dorsopathies, lumbar region: Secondary | ICD-10-CM | POA: Diagnosis not present

## 2022-10-21 DIAGNOSIS — M9902 Segmental and somatic dysfunction of thoracic region: Secondary | ICD-10-CM | POA: Diagnosis not present

## 2022-10-21 DIAGNOSIS — M9904 Segmental and somatic dysfunction of sacral region: Secondary | ICD-10-CM | POA: Diagnosis not present

## 2022-10-21 DIAGNOSIS — M5134 Other intervertebral disc degeneration, thoracic region: Secondary | ICD-10-CM | POA: Diagnosis not present

## 2022-10-21 DIAGNOSIS — M62830 Muscle spasm of back: Secondary | ICD-10-CM | POA: Diagnosis not present

## 2022-10-21 DIAGNOSIS — M9903 Segmental and somatic dysfunction of lumbar region: Secondary | ICD-10-CM | POA: Diagnosis not present

## 2022-10-21 DIAGNOSIS — M9905 Segmental and somatic dysfunction of pelvic region: Secondary | ICD-10-CM | POA: Diagnosis not present

## 2022-10-21 DIAGNOSIS — M5137 Other intervertebral disc degeneration, lumbosacral region: Secondary | ICD-10-CM | POA: Diagnosis not present

## 2022-10-21 DIAGNOSIS — M9901 Segmental and somatic dysfunction of cervical region: Secondary | ICD-10-CM | POA: Diagnosis not present

## 2022-11-02 ENCOUNTER — Other Ambulatory Visit: Payer: Self-pay | Admitting: Family Medicine

## 2022-11-02 DIAGNOSIS — M25471 Effusion, right ankle: Secondary | ICD-10-CM

## 2022-11-02 NOTE — Telephone Encounter (Signed)
ATC to advise pt but VM was full

## 2022-11-02 NOTE — Telephone Encounter (Signed)
Are we continuing this medication? Per ov notes it looks like this has been an ongoing problem but was prescribed for the acute issue

## 2022-12-16 ENCOUNTER — Ambulatory Visit: Payer: 59 | Admitting: Internal Medicine

## 2023-01-03 ENCOUNTER — Encounter (HOSPITAL_COMMUNITY): Payer: Self-pay

## 2023-01-03 ENCOUNTER — Ambulatory Visit (HOSPITAL_COMMUNITY)
Admission: RE | Admit: 2023-01-03 | Discharge: 2023-01-03 | Disposition: A | Discharge status: Home / Self Care | Payer: 59 | Source: Ambulatory Visit | Attending: Family Medicine | Admitting: Family Medicine

## 2023-01-03 ENCOUNTER — Other Ambulatory Visit: Payer: Self-pay

## 2023-01-03 VITALS — BP 116/74 | HR 60 | Temp 98.2°F | Resp 18

## 2023-01-03 DIAGNOSIS — R1013 Epigastric pain: Secondary | ICD-10-CM

## 2023-01-03 DIAGNOSIS — K219 Gastro-esophageal reflux disease without esophagitis: Secondary | ICD-10-CM

## 2023-01-03 MED ORDER — LANSOPRAZOLE 30 MG PO CPDR
30.0000 mg | DELAYED_RELEASE_CAPSULE | Freq: Two times a day (BID) | ORAL | 2 refills | Status: DC
Start: 2023-01-03 — End: 2023-05-31

## 2023-01-03 MED ORDER — ALUM & MAG HYDROXIDE-SIMETH 200-200-20 MG/5ML PO SUSP
30.0000 mL | Freq: Once | ORAL | Status: AC
  Administered 2023-01-03: 30 mL via ORAL

## 2023-01-03 MED ORDER — LIDOCAINE VISCOUS HCL 2 % MT SOLN
15.0000 mL | Freq: Once | OROMUCOSAL | Status: AC
  Administered 2023-01-03: 15 mL via OROMUCOSAL

## 2023-01-03 MED ORDER — LIDOCAINE VISCOUS HCL 2 % MT SOLN
OROMUCOSAL | Status: AC
  Filled 2023-01-03: qty 15

## 2023-01-03 MED ORDER — ALUM & MAG HYDROXIDE-SIMETH 200-200-20 MG/5ML PO SUSP
ORAL | Status: AC
  Filled 2023-01-03: qty 30

## 2023-01-03 NOTE — ED Provider Notes (Signed)
[facility]    CSN: [id number] Arrival date & time: 01/03/23  1532      History   Chief Complaint Chief Complaint  Patient presents with   Constipation    Indigestion, severe burning, extreme constipation, also feel like there's something in my throat - Entered by patient    HPI Ellen Roberts is a 27 y.o. female.    Constipation Associated symptoms: abdominal pain    Patient is here for stomach pain.  2 days ago she got a chicken sandwhich, and then started with slight abd pain.  She vomited slightly.  Pain at the upper/mid abdomen.  Yesterday she had a salad, and thought she was not "digesting" all day. Today she feels that she cannot eat anything due to discomfort in her stomach.  She was on medication for acid reflux late last year, and not sure if that is similar to this.   She is burping more than normal.   Pain is at the whole middle of the stomach.  She is able to drink okay today.  Last night she did have several Bms after taking something.  It feels like there is something in her throat when there's not.  No otc meds taken.  No fevers/chills.        Past Medical History:  Diagnosis Date   Anemia     There are no problems to display for this patient.   Past Surgical History:  Procedure Laterality Date   HERNIA REPAIR      OB History     Gravida  0   Para      Term      Preterm      AB      Living         SAB      IAB      Ectopic      Multiple      Live Births               Home Medications    Prior to Admission medications   Medication Sig Start Date End Date Taking? Authorizing Provider  hydrochlorothiazide (MICROZIDE) 12.5 MG capsule Take 1 capsule (12.5 mg total) by mouth daily as needed for ankle edema. Patient not taking: Reported on 01/03/2023 10/06/22   Avanell Shackleton, NP-C  lansoprazole (PREVACID) 30 MG capsule Take 1 capsule (30 mg total) by mouth 2 (two) times daily before a meal. Patient not  taking: Reported on 10/06/2022 03/07/22   Imogene Burn, MD    Family History Family History  Problem Relation Age of Onset   Diabetes Mother    Hypertension Mother    Colon cancer Neg Hx    Liver disease Neg Hx    Esophageal cancer Neg Hx     Social History Social History   Tobacco Use   Smoking status: Never   Smokeless tobacco: Never  Vaping Use   Vaping status: Never Used  Substance Use Topics   Alcohol use: No   Drug use: No     Allergies   Peanut-containing drug products and Aspirin   Review of Systems Review of Systems  Constitutional: Negative.   HENT: Negative.    Respiratory: Negative.    Cardiovascular: Negative.   Gastrointestinal:  Positive for abdominal pain and constipation.  Musculoskeletal: Negative.   Skin: Negative.   Psychiatric/Behavioral: Negative.       Physical Exam Triage Vital Signs ED Triage Vitals  Encounter Vitals Group  BP 01/03/23 1557 116/74     Systolic BP Percentile --      Diastolic BP Percentile --      Pulse Rate 01/03/23 1557 60     Resp 01/03/23 1557 18     Temp 01/03/23 1557 98.2 F (36.8 C)     Temp Source 01/03/23 1557 Oral     SpO2 01/03/23 1557 99 %     Weight --      Height --      Head Circumference --      Peak Flow --      Pain Score 01/03/23 1555 0     Pain Loc --      Pain Education --      Exclude from Growth Chart --    No data found.  Updated Vital Signs BP 116/74 (BP Location: Left Arm)   Pulse 60   Temp 98.2 F (36.8 C) (Oral)   Resp 18   LMP 12/28/2022   SpO2 99%   Visual Acuity Right Eye Distance:   Left Eye Distance:   Bilateral Distance:    Right Eye Near:   Left Eye Near:    Bilateral Near:     Physical Exam Constitutional:      Appearance: Normal appearance.  Cardiovascular:     Rate and Rhythm: Normal rate and regular rhythm.  Pulmonary:     Effort: Pulmonary effort is normal.     Breath sounds: Normal breath sounds.  Abdominal:     General: Bowel sounds are  decreased.     Palpations: Abdomen is soft.     Tenderness: There is abdominal tenderness in the epigastric area and periumbilical area. There is no guarding or rebound.  Musculoskeletal:     Cervical back: Normal range of motion and neck supple.  Neurological:     General: No focal deficit present.     Mental Status: She is alert.  Psychiatric:        Mood and Affect: Mood normal.     UC Treatments / Results  Labs (all labs ordered are listed, but only abnormal results are displayed) Labs Reviewed - No data to display  EKG   Radiology No results found.  Procedures Procedures (including critical care time)  Medications Ordered in UC Medications  alum & mag hydroxide-simeth (MAALOX/MYLANTA) 200-200-20 MG/5ML suspension 30 mL (has no administration in time range)  lidocaine (XYLOCAINE) 2 % viscous mouth solution 15 mL (has no administration in time range)    Initial Impression / Assessment and Plan / UC Course  I have reviewed the triage vital signs and the nursing notes.  Pertinent labs & imaging results that were available during my care of the patient were reviewed by me and considered in my medical decision making (see chart for details).   Final Clinical Impressions(s) / UC Diagnoses   Final diagnoses:  Epigastric pain     Discharge Instructions      You were seen for epigastric pain.  I have given you a GI cocktail while here today.  Please drink ginger ale, and stay away from alcohol and NSAIDS as they may worsen pain.  I have sent out prevacid twice/day x 30 days.  Please follow up with your primary care provider for further care/discussion.     ED Prescriptions     Medication Sig Dispense Auth. Provider   lansoprazole (PREVACID) 30 MG capsule Take 1 capsule (30 mg total) by mouth 2 (two) times daily before a meal. 60  capsule Jannifer Franklin, MD      PDMP not reviewed this encounter.   Jannifer Franklin, MD 01/03/23 [id number]

## 2023-01-03 NOTE — ED Triage Notes (Signed)
Complains of indigestion.  Complains of the sensation that something is in throat.  Patient also has complaints of constipation.  Had to drink tea to have BM.  Reports consistency of BM was normal for patient

## 2023-01-03 NOTE — ED Triage Notes (Signed)
Patient reports she was vomited x 1 time yesterday, no vomiting today, and denies appetite

## 2023-01-03 NOTE — Discharge Instructions (Addendum)
You were seen for epigastric pain.  I have given you a GI cocktail while here today.  Please drink ginger ale, and stay away from alcohol and NSAIDS as they may worsen pain.  I have sent out prevacid twice/day x 30 days.  Please follow up with your primary care provider for further care/discussion.

## 2023-01-10 ENCOUNTER — Ambulatory Visit: Payer: Self-pay | Admitting: Family Medicine

## 2023-01-10 ENCOUNTER — Encounter: Payer: Self-pay | Admitting: Family Medicine

## 2023-02-14 ENCOUNTER — Telehealth: Payer: Self-pay | Admitting: *Deleted

## 2023-02-14 NOTE — Telephone Encounter (Signed)
Mailbox is full.

## 2023-05-09 ENCOUNTER — Emergency Department (HOSPITAL_COMMUNITY)
Admission: EM | Admit: 2023-05-09 | Discharge: 2023-05-10 | Discharge status: Against Medical Advice | Payer: 59 | Attending: Emergency Medicine | Admitting: Emergency Medicine

## 2023-05-09 ENCOUNTER — Other Ambulatory Visit: Payer: Self-pay

## 2023-05-09 ENCOUNTER — Encounter (HOSPITAL_COMMUNITY): Payer: Self-pay | Admitting: Emergency Medicine

## 2023-05-09 DIAGNOSIS — R1110 Vomiting, unspecified: Secondary | ICD-10-CM | POA: Insufficient documentation

## 2023-05-09 DIAGNOSIS — Z5321 Procedure and treatment not carried out due to patient leaving prior to being seen by health care provider: Secondary | ICD-10-CM | POA: Insufficient documentation

## 2023-05-09 DIAGNOSIS — R1013 Epigastric pain: Secondary | ICD-10-CM | POA: Diagnosis not present

## 2023-05-09 LAB — CBC
HCT: 36.8 % (ref 36.0–46.0)
Hemoglobin: 11.5 g/dL — ABNORMAL LOW (ref 12.0–15.0)
MCH: 25 pg — ABNORMAL LOW (ref 26.0–34.0)
MCHC: 31.3 g/dL (ref 30.0–36.0)
MCV: 80 fL (ref 80.0–100.0)
Platelets: 217 10*3/uL (ref 150–400)
RBC: 4.6 MIL/uL (ref 3.87–5.11)
RDW: 14.1 % (ref 11.5–15.5)
WBC: 3.5 10*3/uL — ABNORMAL LOW (ref 4.0–10.5)
nRBC: 0 % (ref 0.0–0.2)

## 2023-05-09 LAB — URINALYSIS, ROUTINE W REFLEX MICROSCOPIC
Bacteria, UA: NONE SEEN
Bilirubin Urine: NEGATIVE
Glucose, UA: NEGATIVE mg/dL
Hgb urine dipstick: NEGATIVE
Ketones, ur: NEGATIVE mg/dL
Nitrite: NEGATIVE
Protein, ur: NEGATIVE mg/dL
Specific Gravity, Urine: 1.012 (ref 1.005–1.030)
pH: 6 (ref 5.0–8.0)

## 2023-05-09 LAB — COMPREHENSIVE METABOLIC PANEL
ALT: 24 U/L (ref 0–44)
AST: 27 U/L (ref 15–41)
Albumin: 3.6 g/dL (ref 3.5–5.0)
Alkaline Phosphatase: 41 U/L (ref 38–126)
Anion gap: 8 (ref 5–15)
BUN: 10 mg/dL (ref 6–20)
CO2: 26 mmol/L (ref 22–32)
Calcium: 9.4 mg/dL (ref 8.9–10.3)
Chloride: 102 mmol/L (ref 98–111)
Creatinine, Ser: 0.91 mg/dL (ref 0.44–1.00)
GFR, Estimated: >60 mL/min (ref >60)
Glucose, Bld: 87 mg/dL (ref 70–99)
Potassium: 4 mmol/L (ref 3.5–5.1)
Sodium: 136 mmol/L (ref 135–145)
Total Bilirubin: 0.4 mg/dL (ref 0.0–1.2)
Total Protein: 6.9 g/dL (ref 6.5–8.1)

## 2023-05-09 LAB — LIPASE, BLOOD: Lipase: 29 U/L (ref 11–51)

## 2023-05-09 LAB — HCG, SERUM, QUALITATIVE: Preg, Serum: NEGATIVE

## 2023-05-09 NOTE — ED Notes (Signed)
 Pt left AMA

## 2023-05-09 NOTE — ED Triage Notes (Addendum)
 Pt complaints of epigastric pain, vomiting x 7 days. Pt states unable to keep anything down. A lot of burping. Tried OTC tums with no relief. Also complains of abd bloating.

## 2023-05-10 ENCOUNTER — Telehealth: Payer: 59 | Admitting: Family Medicine

## 2023-05-10 DIAGNOSIS — R1110 Vomiting, unspecified: Secondary | ICD-10-CM

## 2023-05-10 DIAGNOSIS — R109 Unspecified abdominal pain: Secondary | ICD-10-CM

## 2023-05-10 DIAGNOSIS — R300 Dysuria: Secondary | ICD-10-CM

## 2023-05-10 NOTE — Progress Notes (Signed)
 Because last 24 hours you have had abdominal pain, vomiting, and chest pain-you will need to go back in person to be seen, we can not treat you after you have had these symptoms and then were not cleared by a provider in person. Your condition warrants further evaluation and I recommend that you be seen in a face to face visit.   NOTE: There will be NO CHARGE for this eVisit   If you are having a true medical emergency please call 911.

## 2023-05-10 NOTE — ED Triage Notes (Signed)
 When I eat I am throwing up whatever I eat and concerned with it having acid in it as well. I was seen in ED yesterday, Triaged and some labs were done but then I had to leave, in need to discuss those labs and the current symptoms. I don't know if an xray needs to be done but maybe. I have a history of H-Pylori. I do have a PCP follow up soon. This flare up or symptoms began about 3-4 days ago. Voids normal, no concerns. Stools normal with history of constipation.

## 2023-05-11 ENCOUNTER — Ambulatory Visit (HOSPITAL_COMMUNITY): Payer: 59

## 2023-05-11 ENCOUNTER — Encounter: Payer: Self-pay | Admitting: Family Medicine

## 2023-05-11 ENCOUNTER — Other Ambulatory Visit (HOSPITAL_COMMUNITY)
Admission: RE | Admit: 2023-05-11 | Discharge: 2023-05-11 | Disposition: A | Discharge status: Home / Self Care | Payer: 59 | Source: Ambulatory Visit | Attending: Family Medicine | Admitting: Family Medicine

## 2023-05-11 ENCOUNTER — Ambulatory Visit (INDEPENDENT_AMBULATORY_CARE_PROVIDER_SITE_OTHER): Payer: 59 | Admitting: Family Medicine

## 2023-05-11 VITALS — BP 108/60 | HR 70 | Temp 98.5°F | Resp 16 | Ht 63.0 in | Wt 193.0 lb

## 2023-05-11 DIAGNOSIS — R1013 Epigastric pain: Secondary | ICD-10-CM | POA: Diagnosis not present

## 2023-05-11 DIAGNOSIS — N760 Acute vaginitis: Secondary | ICD-10-CM | POA: Insufficient documentation

## 2023-05-11 LAB — POC URINALSYSI DIPSTICK (AUTOMATED)
Bilirubin, UA: NEGATIVE
Blood, UA: NEGATIVE
Glucose, UA: NEGATIVE
Ketones, UA: NEGATIVE
Nitrite, UA: NEGATIVE
Protein, UA: NEGATIVE
Spec Grav, UA: <=1.005 — AB (ref 1.010–1.025)
Urobilinogen, UA: 0.2 U/dL
pH, UA: 6.5 (ref 5.0–8.0)

## 2023-05-11 MED ORDER — LIDOCAINE VISCOUS HCL 2 % MT SOLN
15.0000 mL | Freq: Once | OROMUCOSAL | Status: AC
  Administered 2023-05-11: 15 mL via OROMUCOSAL

## 2023-05-11 MED ORDER — HYOSCYAMINE SULFATE 0.125 MG SL SUBL
0.1250 mg | SUBLINGUAL_TABLET | Freq: Once | SUBLINGUAL | Status: AC
Start: 2023-05-11 — End: 2023-05-11
  Administered 2023-05-11: 0.125 mg via SUBLINGUAL

## 2023-05-11 MED ORDER — OMEPRAZOLE 20 MG PO CPDR
20.0000 mg | DELAYED_RELEASE_CAPSULE | Freq: Every day | ORAL | 3 refills | Status: DC
Start: 2023-05-11 — End: 2023-05-31

## 2023-05-11 MED ORDER — ALUM & MAG HYDROXIDE-SIMETH 200-200-20 MG/5ML PO SUSP
30.0000 mL | Freq: Once | ORAL | Status: AC
Start: 2023-05-11 — End: 2023-05-11
  Administered 2023-05-11: 30 mL via ORAL

## 2023-05-11 MED ORDER — FLUCONAZOLE 150 MG PO TABS
150.0000 mg | ORAL_TABLET | Freq: Every day | ORAL | 0 refills | Status: DC
Start: 2023-05-11 — End: 2023-08-17

## 2023-05-11 NOTE — Progress Notes (Signed)
 Established Patient Office Visit  Subjective   Patient ID: Ellen Roberts, female    DOB: [birth year]  Age: 28 y.o. MRN: [id number]  Chief Complaint  Patient presents with   ER Follow up    Pt would like to discuss labs from ER visit    HPI Discussed the use of AI scribe software for clinical note transcription with the patient, who gave verbal consent to proceed.  History of Present Illness   The patient presented with a chief complaint of digestive discomfort, localized in the upper abdomen, and associated with burping. The discomfort was not described as painful, but rather as a persistent sensation that something's not right. The patient had sought emergency care for the same issue two days prior, as well as in September of the previous year, but left due to long wait times without receiving any treatment.  In addition to the digestive discomfort, the patient reported symptoms of itching and thick, white vaginal discharge, particularly noticeable in the mornings. She suspected a yeast infection, having experienced similar symptoms in the past. The patient also mentioned a potential urinary infection, as indicated by a recent blood test, but denied experiencing any burning sensation during urination.  The patient had not taken any over-the-counter medications for her symptoms. She was due for a physical examination with a new primary care provider the following week. The patient's diet included citrus and pepper, which she was informed could potentially exacerbate her digestive discomfort.       There are no active problems to display for this patient.  Past Medical History:  Diagnosis Date   Anemia    Past Surgical History:  Procedure Laterality Date   HERNIA REPAIR     Social History   Tobacco Use   Smoking status: Never    Passive exposure: Never   Smokeless tobacco: Never  Vaping Use   Vaping status: Never Used  Substance Use Topics   Alcohol use: Not Currently    Drug use: Never   Social History   Socioeconomic History   Marital status: Married    Spouse name: Not on file   Number of children: 0   Years of education: Not on file   Highest education level: Master's degree (e.g., MA, MS, MEng, MEd, MSW, MBA)  Occupational History   Occupation: full time student  Tobacco Use   Smoking status: Never    Passive exposure: Never   Smokeless tobacco: Never  Vaping Use   Vaping status: Never Used  Substance and Sexual Activity   Alcohol use: Not Currently   Drug use: Never   Sexual activity: Not Currently    Birth control/protection: None  Other Topics Concern   Not on file  Social History Narrative   Not on file   Social Drivers of Health   Financial Resource Strain: Medium Risk (10/06/2022)   Overall Financial Resource Strain (CARDIA)    Difficulty of Paying Living Expenses: Somewhat hard  Food Insecurity: Food Insecurity Present (10/06/2022)   Hunger Vital Sign    Worried About Running Out of Food in the Last Year: Patient declined    Ran Out of Food in the Last Year: Sometimes true  Transportation Needs: No Transportation Needs (10/06/2022)   PRAPARE - Administrator, Civil Service (Medical): No    Lack of Transportation (Non-Medical): No  Physical Activity: Insufficiently Active (10/06/2022)   Exercise Vital Sign    Days of Exercise per Week: 1 day    Minutes of  Exercise per Session: 10 min  Stress: No Stress Concern Present (10/06/2022)   [employer] of Occupational Health - Occupational Stress Questionnaire    Feeling of Stress : Only a little  Social Connections: Moderately Integrated (10/06/2022)   Social Connection and Isolation Panel [NHANES]    Frequency of Communication with Friends and Family: More than three times a week    Frequency of Social Gatherings with Friends and Family: Once a week    Attends Religious Services: More than 4 times per year    Active Member of [employer] or Organizations: No    Attends Probation Officer: Not on file    Marital Status: Married  Catering Manager Violence: Not on file   Family Status  Relation Name Status   Mother  Alive   Father  Alive   Neg Hx  (Not Specified)  No partnership data on file   Family History  Problem Relation Age of Onset   Diabetes Mother    Hypertension Mother    Colon cancer Neg Hx    Liver disease Neg Hx    Esophageal cancer Neg Hx    Allergies  Allergen Reactions   Peanut-Containing Drug Products     Ance    Aspirin Itching and Rash    Stomach pains      Review of Systems  Constitutional:  Negative for fever and malaise/fatigue.  HENT:  Negative for congestion.   Eyes:  Negative for blurred vision.  Respiratory:  Negative for cough and shortness of breath.   Cardiovascular:  Negative for chest pain, palpitations and leg swelling.  Gastrointestinal:  Negative for abdominal pain, blood in stool, nausea and vomiting.  Genitourinary:  Negative for dysuria and frequency.  Musculoskeletal:  Negative for back pain and falls.  Skin:  Negative for rash.  Neurological:  Negative for dizziness, loss of consciousness and headaches.  Endo/Heme/Allergies:  Negative for environmental allergies.  Psychiatric/Behavioral:  Negative for depression. The patient is not nervous/anxious.       Objective:     BP 108/60 (BP Location: Right Arm, Patient Position: Sitting, Cuff Size: Normal)   Pulse 70   Temp 98.5 F (36.9 C) (Oral)   Resp 16   Ht 5' 3 (1.6 m)   Wt 193 lb (87.5 kg)   LMP 04/11/2023 (Approximate)   SpO2 99%   BMI 34.19 kg/m  BP Readings from Last 3 Encounters:  05/11/23 108/60  05/10/23 103/65  05/09/23 112/70   Wt Readings from Last 3 Encounters:  05/11/23 193 lb (87.5 kg)  05/10/23 180 lb (81.6 kg)  05/09/23 180 lb (81.6 kg)   SpO2 Readings from Last 3 Encounters:  05/11/23 99%  05/10/23 97%  05/09/23 98%      Physical Exam Vitals and nursing note reviewed.  Constitutional:      General:  She is not in acute distress.    Appearance: Normal appearance. She is well-developed.  HENT:     Head: Normocephalic and atraumatic.  Eyes:     General: No scleral icterus.       Right eye: No discharge.        Left eye: No discharge.  Cardiovascular:     Rate and Rhythm: Normal rate and regular rhythm.     Heart sounds: No murmur heard. Pulmonary:     Effort: Pulmonary effort is normal. No respiratory distress.     Breath sounds: Normal breath sounds.  Musculoskeletal:        General: Normal  range of motion.     Cervical back: Normal range of motion and neck supple.     Right lower leg: No edema.     Left lower leg: No edema.  Skin:    General: Skin is warm and dry.  Neurological:     Mental Status: She is alert and oriented to person, place, and time.  Psychiatric:        Mood and Affect: Mood normal.        Behavior: Behavior normal.        Thought Content: Thought content normal.        Judgment: Judgment normal.      Results for orders placed or performed in visit on 05/11/23  POCT Urinalysis Dipstick (Automated)  Result Value Ref Range   Color, UA yellow    Clarity, UA clear    Glucose, UA Negative Negative   Bilirubin, UA negative    Ketones, UA negative    Spec Grav, UA <=1.005 (A) 1.010 - 1.025   Blood, UA negative    pH, UA 6.5 5.0 - 8.0   Protein, UA Negative Negative   Urobilinogen, UA 0.2 0.2 or 1.0 E.U./dL   Nitrite, UA negative    Leukocytes, UA Moderate (2+) (A) Negative    Last CBC Lab Results  Component Value Date   WBC 3.5 (L) 05/09/2023   HGB 11.5 (L) 05/09/2023   HCT 36.8 05/09/2023   MCV 80.0 05/09/2023   MCH 25.0 (L) 05/09/2023   RDW 14.1 05/09/2023   PLT 217 05/09/2023   Last metabolic panel Lab Results  Component Value Date   GLUCOSE 87 05/09/2023   NA 136 05/09/2023   K 4.0 05/09/2023   CL 102 05/09/2023   CO2 26 05/09/2023   BUN 10 05/09/2023   CREATININE 0.91 05/09/2023   GFRNONAA >60 05/09/2023   CALCIUM 9.4  05/09/2023   PROT 6.9 05/09/2023   ALBUMIN 3.6 05/09/2023   LABGLOB 3.1 04/13/2022   AGRATIO 1.3 04/13/2022   BILITOT 0.4 05/09/2023   ALKPHOS 41 05/09/2023   AST 27 05/09/2023   ALT 24 05/09/2023   ANIONGAP 8 05/09/2023   Last lipids No results found for: CHOL, HDL, LDLCALC, LDLDIRECT, TRIG, CHOLHDL Last hemoglobin A1c Lab Results  Component Value Date   HGBA1C 5.3 09/23/2015   Last thyroid  functions Lab Results  Component Value Date   TSH 2.16 10/06/2022   Last vitamin D  No results found for: 25OHVITD2, 25OHVITD3, VD25OH Last vitamin B12 and Folate Lab Results  Component Value Date   VITAMINB12 516 01/12/2022   FOLATE 16.6 01/12/2022      The ASCVD Risk score (Arnett DK, et al., 2019) failed to calculate for the following reasons:   The 2019 ASCVD risk score is only valid for ages 89 to 4    Assessment & Plan:   Problem List Items Addressed This Visit   None Visit Diagnoses       Dyspepsia    -  Primary   Relevant Medications   omeprazole  (PRILOSEC) 20 MG capsule   alum & mag hydroxide-simeth (MAALOX/MYLANTA) 200-200-20 MG/5ML suspension 30 mL (Completed)   lidocaine  (XYLOCAINE ) 2 % viscous mouth solution 15 mL (Completed)   hyoscyamine  (LEVSIN  SL) SL tablet 0.125 mg (Completed)     Acute vaginitis       Relevant Medications   fluconazole  (DIFLUCAN ) 150 MG tablet   Other Relevant Orders   POCT Urinalysis Dipstick (Automated) (Completed)   Cervicovaginal ancillary only( Lydia)  Assessment and Plan    Gastroesophageal Reflux Disease (GERD)   She has experienced persistent digestive discomfort with burping and upper abdominal unease since her ER visit two days ago, without significant abdominal pain. Her symptoms, likely exacerbated by dietary habits including citrus and peppers, were addressed with a plan that includes administering medication for immediate relief and prescribing daily GERD medication. Dietary modifications were  advised to avoid caffeine, alcohol, citrus, tomatoes, peppermint, onions, and fatty foods. A referral to a gastroenterologist will be made if symptoms persist.  Suspected Vaginal Candidiasis   She presents with itching and thick white discharge consistent with a yeast infection. A self-swab and urine test will be performed to confirm the diagnosis and rule out other infections. Diflucan  (fluconazole ) 150 mg was prescribed, with instructions to take one pill now and another in three days if needed.  General Health Maintenance   Anticipatory guidance on dietary modifications to manage GERD symptoms was provided. Dietary recommendations were documented in MyChart and the after-visit summary.  Follow-up   She will follow up with her primary care physician next week.        No follow-ups on file.    Reene Harlacher R Lowne Chase, DO

## 2023-05-12 LAB — CERVICOVAGINAL ANCILLARY ONLY
Bacterial Vaginitis (gardnerella): NEGATIVE
Candida Glabrata: NEGATIVE
Candida Vaginitis: POSITIVE — AB
Chlamydia: NEGATIVE
Comment: NEGATIVE
Comment: NEGATIVE
Comment: NEGATIVE
Comment: NEGATIVE
Comment: NEGATIVE
Comment: NORMAL
Neisseria Gonorrhea: NEGATIVE
Trichomonas: NEGATIVE

## 2023-05-16 ENCOUNTER — Encounter: Payer: Self-pay | Admitting: Emergency Medicine

## 2023-05-24 ENCOUNTER — Ambulatory Visit: Payer: Self-pay | Admitting: Emergency Medicine

## 2023-05-24 ENCOUNTER — Encounter: Payer: Self-pay | Admitting: Family Medicine

## 2023-05-31 ENCOUNTER — Ambulatory Visit (INDEPENDENT_AMBULATORY_CARE_PROVIDER_SITE_OTHER): Payer: 59 | Admitting: Emergency Medicine

## 2023-05-31 ENCOUNTER — Encounter: Payer: Self-pay | Admitting: Emergency Medicine

## 2023-05-31 VITALS — BP 104/68 | HR 72 | Temp 97.9°F | Ht 63.0 in | Wt 195.0 lb

## 2023-05-31 DIAGNOSIS — L918 Other hypertrophic disorders of the skin: Secondary | ICD-10-CM | POA: Diagnosis not present

## 2023-05-31 DIAGNOSIS — R1013 Epigastric pain: Secondary | ICD-10-CM | POA: Diagnosis not present

## 2023-05-31 MED ORDER — PANTOPRAZOLE SODIUM 40 MG PO TBEC
40.0000 mg | DELAYED_RELEASE_TABLET | Freq: Every day | ORAL | 3 refills | Status: DC
Start: 2023-05-31 — End: 2023-08-20

## 2023-05-31 NOTE — Patient Instructions (Signed)
GERD in Adults: What to Know  Gastroesophageal reflux (GER) is when acid from your stomach flows up into your esophagus. Your esophagus is the part of your body that moves food from your mouth to your stomach. Normally, food goes down and stays in your stomach to be digested. But with GER, food and stomach acid may go back up. You may have a disease called gastroesophageal reflux disease (GERD) if the reflux: Happens often. Causes very bad symptoms. Makes your esophagus sore and swollen. Over time, GERD can make small holes called ulcers in the lining of your esophagus. What are the causes? GERD is caused by a problem with the muscle between your esophagus and stomach. This muscle is called the lower esophageal sphincter (LES). When it's weak or not normal, it doesn't close like it should. This means food and stomach acid can go back up into your esophagus. The muscle can be weak if: You smoke or use products with tobacco in them. You're pregnant. You have a type of hernia called a hiatal hernia. You eat certain foods and drinks. These include: Alcohol. Coffee. Chocolate. Onions. Peppermint. What increases the risk? Being overweight. Having a disease that affects your connective tissue. Taking NSAIDs, such as ibuprofen. What are the signs or symptoms? Heartburn. Trouble swallowing. Pain when you swallow. The feeling of having a lump in your throat. A bitter taste in your mouth. Bad breath. Having an upset or bloated stomach. Burping. Chest pain. Other conditions can also cause chest pain. Make sure you see your health care provider if you have chest pain. Wheezing. This is when you make high-pitched whistling sounds when you breathe, most often when you breathe out. A long-term cough or a cough at night. How is this diagnosed? GERD may be diagnosed based on your medical history and a physical exam. You may also have tests. These may include: An endoscopy. This test looks at your  stomach and esophagus with a small camera. A barium swallow test. This shows the shape and size of your esophagus and how well it's working. Tests of your esophagus to check for: Acid levels. Pressure. How is this treated? Treatment may depend on how bad your symptoms are. It may include: Changes to your diet and daily life. Medicines. Surgery. Follow these instructions at home: Eating and drinking Follow an eating plan as told by your provider. You may need to avoid certain foods and drinks. These may include: Coffee and tea, with or without caffeine. Alcohol. Energy drinks and sports drinks. Fizzy drinks or sodas. Chocolate and cocoa. Peppermint and mint flavorings. Garlic and onions. Horseradish. Spicy and acidic foods. These include: Peppers. Chili powder and curry powder. Vinegar. Hot sauces and BBQ sauce. Citrus fruits and juices. These include: Oranges. Lemons. Limes. Tomato-based foods. These include: Red sauce and pizza with red sauce. Chili. Salsa. Fried and fatty foods. These include: Donuts. [place] fries. Potato chips. High-fat dressings. High-fat meats. These include: Hot dogs and sausage. Rib eye steak. Ham and bacon. High-fat dairy items. These include: Whole milk. Butter. Cream cheese. Eat small meals often. Avoid eating big meals. Avoid drinking lots of liquid with your meals. Try not to eat meals during the 2-3 hours before bedtime. Try not to lie down right after you eat. Do not exercise right after you eat. Lifestyle  If you're overweight, lose an amount of weight that's healthy for you. Ask your provider about a safe weight loss goal. Do not smoke, vape, or use nicotine or tobacco. Wear  loose clothes. Do not wear things that are tight around your waist. When you sleep, try: Raising the head of your bed about 6 inches (15 cm). You can use a wedge to do this. Lying down on your left side. Try to lower your stress. If you need help doing  this, ask your provider. General instructions Take your medicines only as told. Do not take aspirin or ibuprofen unless you're told to. Watch for any changes in your symptoms. Do not bend over if it makes your symptoms worse. Contact a health care provider if: You have new symptoms. You have trouble: Drinking. Swallowing. Eating. It hurts to swallow. You have wheezing. You have a cough that won't go away. Your voice is hoarse. Your symptoms don't get better with treatment. Get help right away if: You have pain all of a sudden in your: Arm. Neck. Jaw. Teeth. Back. You feel sweaty, dizzy, or light-headed all of a sudden. You faint. You have chest pain or shortness of breath. You vomit and the vomit is: Green, yellow, or black. Looks like blood or coffee grounds. Your poop is red, bloody, or black. These symptoms may be an emergency. Call 911 right away. Do not wait to see if the symptoms will go away. Do not drive yourself to the hospital. This information is not intended to replace advice given to you by your health care provider. Make sure you discuss any questions you have with your health care provider. Document Revised: 02/28/2023 Document Reviewed: 09/14/2022 Elsevier Patient Education  2024 [employer].

## 2023-05-31 NOTE — Progress Notes (Signed)
Arrabella Crumm 28 y.o.   Chief Complaint  Patient presents with   GI Problem    She's states going to the ED for stomach issues, she says it feels like something is "stuck" when she eats. She is taking omeprazole for the issue and it is helping.  She also wants a Referral for dermatology for skin tags and skin     HISTORY OF PRESENT ILLNESS: Acute problem visit today. This is a 28 y.o. female complaining of chronic upper abdominal issues including heartburn, some dysphagia, occasional nausea vomiting Symptoms have been on and off for at least 2 to 3 years Taking omeprazole but not helping much Also requesting referral to dermatologist for multiple skin tags  GI Problem The primary symptoms include nausea and rash. Primary symptoms do not include fever, abdominal pain, diarrhea, melena or dysuria.  The illness does not include chills.     Prior to Admission medications   Medication Sig Start Date End Date Taking? Authorizing Provider  Multiple Vitamin (MULTIVITAMIN) capsule Take 1 capsule by mouth daily.   Yes [provider]  omeprazole (PRILOSEC) 20 MG capsule Take 1 capsule (20 mg total) by mouth daily. 05/11/23  Yes Seabron Spates R, DO  Ferrous Sulfate (IRON PO) Take by mouth. Liquid Suppliment.    [provider]  fluconazole (DIFLUCAN) 150 MG tablet Take 1 tablet (150 mg total) by mouth daily. May repeat in 3 days if needed. Patient not taking: Reported on 05/31/2023 05/11/23   Zola Button, Grayling Congress, DO  lansoprazole (PREVACID) 30 MG capsule Take 1 capsule (30 mg total) by mouth 2 (two) times daily before a meal. Patient not taking: Reported on 05/31/2023 01/03/23   Jannifer Franklin, MD    Allergies  Allergen Reactions   Peanut-Containing Drug Products     Ance    Aspirin Itching and Rash    Stomach pains    There are no active problems to display for this patient.   Past Medical History:  Diagnosis Date   Anemia     Past Surgical History:   Procedure Laterality Date   HERNIA REPAIR      Social History   Socioeconomic History   Marital status: Married    Spouse name: Not on file   Number of children: 0   Years of education: Not on file   Highest education level: Master's degree (e.g., MA, MS, MEng, MEd, MSW, MBA)  Occupational History   Occupation: full time student  Tobacco Use   Smoking status: Never    Passive exposure: Never   Smokeless tobacco: Never  Vaping Use   Vaping status: Never Used  Substance and Sexual Activity   Alcohol use: Not Currently   Drug use: Never   Sexual activity: Not Currently    Birth control/protection: None  Other Topics Concern   Not on file  Social History Narrative   Not on file   Social Drivers of Health   Financial Resource Strain: Low Risk  (05/30/2023)   Overall Financial Resource Strain (CARDIA)    Difficulty of Paying Living Expenses: Not very hard  Food Insecurity: Patient Declined (05/30/2023)   Hunger Vital Sign    Worried About Running Out of Food in the Last Year: Patient declined    Ran Out of Food in the Last Year: Patient declined  Transportation Needs: No Transportation Needs (05/30/2023)   PRAPARE - Administrator, Civil Service (Medical): No    Lack of Transportation (Non-Medical): No  Physical Activity: Insufficiently Active (05/30/2023)   Exercise Vital Sign    Days of Exercise per Week: 3 days    Minutes of Exercise per Session: 20 min  Stress: No Stress Concern Present (05/30/2023)   [employer] of Occupational Health - Occupational Stress Questionnaire    Feeling of Stress : Only a little  Social Connections: Socially Integrated (05/30/2023)   Social Connection and Isolation Panel [NHANES]    Frequency of Communication with Friends and Family: Three times a week    Frequency of Social Gatherings with Friends and Family: Three times a week    Attends Religious Services: More than 4 times per year    Active Member of Clubs or  Organizations: Yes    Attends Engineer, structural: More than 4 times per year    Marital Status: Married  Catering manager Violence: Not on file    Family History  Problem Relation Age of Onset   Diabetes Mother    Hypertension Mother    Colon cancer Neg Hx    Liver disease Neg Hx    Esophageal cancer Neg Hx      Review of Systems  Constitutional: Negative.  Negative for chills and fever.  HENT: Negative.  Negative for congestion and sore throat.   Respiratory: Negative.  Negative for cough and shortness of breath.   Cardiovascular: Negative.  Negative for chest pain and palpitations.  Gastrointestinal:  Positive for heartburn and nausea. Negative for abdominal pain, blood in stool, diarrhea and melena.  Genitourinary: Negative.  Negative for dysuria and hematuria.  Skin:  Positive for rash.  Neurological: Negative.  Negative for dizziness and headaches.    Today's Vitals   05/31/23 0927  BP: 104/68  Pulse: 72  Temp: 97.9 F (36.6 C)  TempSrc: Oral  SpO2: 97%  Weight: 195 lb (88.5 kg)  Height: 5\' 3"  (1.6 m)   Body mass index is 34.54 kg/m.   Physical Exam Vitals reviewed.  Constitutional:      Appearance: Normal appearance.  HENT:     Head: Normocephalic.  Eyes:     Extraocular Movements: Extraocular movements intact.  Cardiovascular:     Rate and Rhythm: Normal rate.  Pulmonary:     Effort: Pulmonary effort is normal.  Abdominal:     Palpations: Abdomen is soft.     Tenderness: There is no abdominal tenderness.  Skin:    General: Skin is warm and dry.     Comments: Multiple small skin tags throughout her body  Neurological:     Mental Status: She is alert and oriented to person, place, and time.  Psychiatric:        Mood and Affect: Mood normal.        Behavior: Behavior normal.      ASSESSMENT & PLAN: A total of 33 minutes was spent with the patient and counseling/coordination of care regarding preparing for this visit, review of most  recent office visit notes and available medical records, review of most recent blood work results, diagnosis of dyspepsia and management, review of all medications and changes made, need for GI evaluation and upper endoscopy, education on nutrition, prognosis, documentation and need for follow-up after GI evaluation  Problem List Items Addressed This Visit       Musculoskeletal and Integument   Skin tags, multiple acquired   Relevant Orders   Ambulatory referral to Dermatology     Other   Dyspepsia - Primary   Chronic problem.  Currently active and  affecting quality of life Persistent symptoms despite taking omeprazole Clinically stable.  No red flag signs or symptoms Recommend to switch to pantoprazole 40 mg daily Needs GI evaluation for possible upper endoscopy Referral placed today Diet and nutrition discussed ED precautions given Advised to follow-up with PCP after GI evaluation      Relevant Medications   pantoprazole (PROTONIX) 40 MG tablet   Other Relevant Orders   Ambulatory referral to Gastroenterology   Patient Instructions  GERD in Adults: What to Know  Gastroesophageal reflux (GER) is when acid from your stomach flows up into your esophagus. Your esophagus is the part of your body that moves food from your mouth to your stomach. Normally, food goes down and stays in your stomach to be digested. But with GER, food and stomach acid may go back up. You may have a disease called gastroesophageal reflux disease (GERD) if the reflux: Happens often. Causes very bad symptoms. Makes your esophagus sore and swollen. Over time, GERD can make small holes called ulcers in the lining of your esophagus. What are the causes? GERD is caused by a problem with the muscle between your esophagus and stomach. This muscle is called the lower esophageal sphincter (LES). When it's weak or not normal, it doesn't close like it should. This means food and stomach acid can go back up into your  esophagus. The muscle can be weak if: You smoke or use products with tobacco in them. You're pregnant. You have a type of hernia called a hiatal hernia. You eat certain foods and drinks. These include: Alcohol. Coffee. Chocolate. Onions. Peppermint. What increases the risk? Being overweight. Having a disease that affects your connective tissue. Taking NSAIDs, such as ibuprofen. What are the signs or symptoms? Heartburn. Trouble swallowing. Pain when you swallow. The feeling of having a lump in your throat. A bitter taste in your mouth. Bad breath. Having an upset or bloated stomach. Burping. Chest pain. Other conditions can also cause chest pain. Make sure you see your health care provider if you have chest pain. Wheezing. This is when you make high-pitched whistling sounds when you breathe, most often when you breathe out. A long-term cough or a cough at night. How is this diagnosed? GERD may be diagnosed based on your medical history and a physical exam. You may also have tests. These may include: An endoscopy. This test looks at your stomach and esophagus with a small camera. A barium swallow test. This shows the shape and size of your esophagus and how well it's working. Tests of your esophagus to check for: Acid levels. Pressure. How is this treated? Treatment may depend on how bad your symptoms are. It may include: Changes to your diet and daily life. Medicines. Surgery. Follow these instructions at home: Eating and drinking Follow an eating plan as told by your provider. You may need to avoid certain foods and drinks. These may include: Coffee and tea, with or without caffeine. Alcohol. Energy drinks and sports drinks. Fizzy drinks or sodas. Chocolate and cocoa. Peppermint and mint flavorings. Garlic and onions. Horseradish. Spicy and acidic foods. These include: Peppers. Chili powder and curry powder. Vinegar. Hot sauces and BBQ sauce. Citrus fruits and  juices. These include: Oranges. Lemons. Limes. Tomato-based foods. These include: Red sauce and pizza with red sauce. Chili. Salsa. Fried and fatty foods. These include: Donuts. [place] fries. Potato chips. High-fat dressings. High-fat meats. These include: Hot dogs and sausage. Rib eye steak. Ham and bacon. High-fat dairy items. These  include: Whole milk. Butter. Cream cheese. Eat small meals often. Avoid eating big meals. Avoid drinking lots of liquid with your meals. Try not to eat meals during the 2-3 hours before bedtime. Try not to lie down right after you eat. Do not exercise right after you eat. Lifestyle  If you're overweight, lose an amount of weight that's healthy for you. Ask your provider about a safe weight loss goal. Do not smoke, vape, or use nicotine or tobacco. Wear loose clothes. Do not wear things that are tight around your waist. When you sleep, try: Raising the head of your bed about 6 inches (15 cm). You can use a wedge to do this. Lying down on your left side. Try to lower your stress. If you need help doing this, ask your provider. General instructions Take your medicines only as told. Do not take aspirin or ibuprofen unless you're told to. Watch for any changes in your symptoms. Do not bend over if it makes your symptoms worse. Contact a health care provider if: You have new symptoms. You have trouble: Drinking. Swallowing. Eating. It hurts to swallow. You have wheezing. You have a cough that won't go away. Your voice is hoarse. Your symptoms don't get better with treatment. Get help right away if: You have pain all of a sudden in your: Arm. Neck. Jaw. Teeth. Back. You feel sweaty, dizzy, or light-headed all of a sudden. You faint. You have chest pain or shortness of breath. You vomit and the vomit is: Green, yellow, or black. Looks like blood or coffee grounds. Your poop is red, bloody, or black. These symptoms may be an  emergency. Call 911 right away. Do not wait to see if the symptoms will go away. Do not drive yourself to the hospital. This information is not intended to replace advice given to you by your health care provider. Make sure you discuss any questions you have with your health care provider. Document Revised: 02/28/2023 Document Reviewed: 09/14/2022 Elsevier Patient Education  2024 Elsevier Inc.    Edwina Barth, MD [facility]

## 2023-05-31 NOTE — Assessment & Plan Note (Signed)
Chronic problem.  Currently active and affecting quality of life Persistent symptoms despite taking omeprazole Clinically stable.  No red flag signs or symptoms Recommend to switch to pantoprazole 40 mg daily Needs GI evaluation for possible upper endoscopy Referral placed today Diet and nutrition discussed ED precautions given Advised to follow-up with PCP after GI evaluation

## 2023-07-17 NOTE — Progress Notes (Unsigned)
 Ellen Roberts

## 2023-07-18 ENCOUNTER — Ambulatory Visit: Payer: 59 | Admitting: Gastroenterology

## 2023-08-11 ENCOUNTER — Ambulatory Visit (HOSPITAL_COMMUNITY)

## 2023-08-14 ENCOUNTER — Ambulatory Visit: Admitting: Family Medicine

## 2023-08-17 ENCOUNTER — Ambulatory Visit (INDEPENDENT_AMBULATORY_CARE_PROVIDER_SITE_OTHER): Admitting: Family Medicine

## 2023-08-17 ENCOUNTER — Encounter: Payer: Self-pay | Admitting: Family Medicine

## 2023-08-17 VITALS — BP 122/90 | HR 74 | Temp 97.5°F | Ht 63.0 in | Wt 190.0 lb

## 2023-08-17 DIAGNOSIS — Z2989 Encounter for other specified prophylactic measures: Secondary | ICD-10-CM | POA: Diagnosis not present

## 2023-08-17 DIAGNOSIS — Z7184 Encounter for health counseling related to travel: Secondary | ICD-10-CM | POA: Diagnosis not present

## 2023-08-17 MED ORDER — FLUCONAZOLE 150 MG PO TABS: 150.0000 mg | ORAL_TABLET | Freq: Once | ORAL | 0 refills | Status: AC

## 2023-08-17 MED ORDER — ATOVAQUONE-PROGUANIL HCL 250-100 MG PO TABS
1.0000 | ORAL_TABLET | Freq: Every day | ORAL | 0 refills | Status: DC
Start: 2023-08-17 — End: 2023-09-18

## 2023-08-17 NOTE — Patient Instructions (Signed)
 Atovaquone; Proguanil Tablets What is this medication? ATOVAQUONE; PROGUANIL (a TOE va kwone; pro GWAN il) prevents and treats malaria. It works by killing the parasite that causes malaria. It will not treat colds, the flu, or infections caused by bacteria or viruses. This medicine may be used for other purposes; ask your health care provider or pharmacist if you have questions. COMMON BRAND NAME(S): Malarone, Malarone Pediatric What should I tell my care team before I take this medication? They need to know if you have any of these conditions: Kidney disease Liver disease Stomach problems An unusual or allergic reaction to atovaquone, proguanil, other medications, foods, dyes, or preservatives Pregnant or trying to get pregnant Breast-feeding How should I use this medication? Take this medication by mouth with water. Take it as directed on the prescription label at the same time every day. Take it with food or a milky drink. Swallow the tablets whole. You may crush the tablet and mix with condensed milk. Swallow the medication and condensed milk right away. Take all of this medication unless your care team tells you to stop it early. Keep taking it even if you think you are better. If you vomit within 1 hour after taking your dose, take your dose again. Talk to your care team about the use of this medication in children. While it may be prescribed for children for selected conditions, precautions do apply. Overdosage: If you think you have taken too much of this medicine contact a poison control center or emergency room at once. NOTE: This medicine is only for you. Do not share this medicine with others. What if I miss a dose? If you miss a dose, take it as soon as you can. If it is almost time for your next dose, take only that dose. Do not take double or extra doses. What may interact with this medication? Certain medications that prevent or treat blood clots, such as  warfarin Metoclopramide Rifabutin Rifampin Tetracycline This list may not describe all possible interactions. Give your health care provider a list of all the medicines, herbs, non-prescription drugs, or dietary supplements you use. Also tell them if you smoke, drink alcohol, or use illegal drugs. Some items may interact with your medicine. What should I watch for while using this medication? Visit your care team for regular checks on your progress. Tell your care team if your symptoms do not start to get better or if they get worse. This medication can make you more sensitive to the sun. Keep out of the sun. If you cannot avoid being in the sun, wear protective clothing and sunscreen. Do not use sun lamps or tanning beds/booths. This medication may cause serious skin reactions. They can happen weeks to months after starting the medication. Contact your care team right away if you notice fevers or flu-like symptoms with a rash. The rash may be red or purple and then turn into blisters or peeling of the skin. You may also notice a red rash with swelling of the face, lips, or lymph nodes in your neck or under your arms. What side effects may I notice from receiving this medication? Side effects that you should report to your care team as soon as possible: Allergic reactions--skin rash, itching, hives, swelling of the face, lips, tongue, or throat Liver injury--right upper belly pain, loss of appetite, nausea, light-colored stool, dark yellow or brown urine, yellowing skin or eyes, unusual weakness or fatigue Side effects that usually do not require medical attention (report to  your care team if they continue or are bothersome): Cough Diarrhea Headache Nausea Stomach pain Vivid dreams or nightmares Vomiting This list may not describe all possible side effects. Call your doctor for medical advice about side effects. You may report side effects to FDA at 1-800-FDA-1088. Where should I keep my  medication? Keep out of the reach of children and pets. Store between 15 and 30 degrees C (59 and 86 degrees F). Get rid of any unused medication after the expiration date. To get rid of medications that are no longer needed or have expired: Take the medication to a medication take-back program. Check with your pharmacy or law enforcement to find a location. If you cannot return the medication, check the label or package insert to see if the medication should be thrown out in the garbage or flushed down the toilet. If you are not sure, ask your care team. If it is safe to put it in the trash, empty the medication out of the container. Mix the medication with cat litter, dirt, coffee grounds, or other unwanted substance. Seal the mixture in a bag or container. Put it in the trash. NOTE: This sheet is a summary. It may not cover all possible information. If you have questions about this medicine, talk to your doctor, pharmacist, or health care provider.  2024 Elsevier/Gold Standard (2021-07-07 00:00:00)

## 2023-08-17 NOTE — Progress Notes (Signed)
 Subjective:     Patient ID: Ellen Roberts, female    DOB: August 17, 1995, 28 y.o.   MRN: [id number]  Chief Complaint  Patient presents with   Medication Management    [place]. Needs med for travel     HPI  History of Present Illness          She is here to discuss upcoming travel to [place].  She will be there for 18 days leaves Aug 31, 2023.   Reports taking antimalarial medication in the past.   Reports having yellow fever and other required vaccines.   Health Maintenance Due  Topic Date Due   Hepatitis C Screening  Never done    Past Medical History:  Diagnosis Date   Anemia     Past Surgical History:  Procedure Laterality Date   HERNIA REPAIR      Family History  Problem Relation Age of Onset   Diabetes Mother    Hypertension Mother    Colon cancer Neg Hx    Liver disease Neg Hx    Esophageal cancer Neg Hx     Social History   Socioeconomic History   Marital status: Married    Spouse name: Not on file   Number of children: 0   Years of education: Not on file   Highest education level: Master's degree (e.g., MA, MS, MEng, MEd, MSW, MBA)  Occupational History   Occupation: full time student  Tobacco Use   Smoking status: Never    Passive exposure: Never   Smokeless tobacco: Never  Vaping Use   Vaping status: Never Used  Substance and Sexual Activity   Alcohol use: Not Currently   Drug use: Never   Sexual activity: Not Currently    Birth control/protection: None  Other Topics Concern   Not on file  Social History Narrative   Not on file   Social Drivers of Health   Financial Resource Strain: Low Risk  (05/30/2023)   Overall Financial Resource Strain (CARDIA)    Difficulty of Paying Living Expenses: Not very hard  Food Insecurity: Patient Declined (05/30/2023)   Hunger Vital Sign    Worried About Running Out of Food in the Last Year: Patient declined    Ran Out of Food in the Last Year: Patient declined  Transportation Needs: No Transportation  Needs (05/30/2023)   PRAPARE - Administrator, Civil Service (Medical): No    Lack of Transportation (Non-Medical): No  Physical Activity: Insufficiently Active (05/30/2023)   Exercise Vital Sign    Days of Exercise per Week: 3 days    Minutes of Exercise per Session: 20 min  Stress: No Stress Concern Present (05/30/2023)   [employer] of Occupational Health - Occupational Stress Questionnaire    Feeling of Stress : Only a little  Social Connections: Socially Integrated (05/30/2023)   Social Connection and Isolation Panel [NHANES]    Frequency of Communication with Friends and Family: Three times a week    Frequency of Social Gatherings with Friends and Family: Three times a week    Attends Religious Services: More than 4 times per year    Active Member of Clubs or Organizations: Yes    Attends Engineer, structural: More than 4 times per year    Marital Status: Married  Catering manager Violence: Not on file    Outpatient Medications Prior to Visit  Medication Sig Dispense Refill   Multiple Vitamin (MULTIVITAMIN) capsule Take 1 capsule by mouth daily.  pantoprazole  (PROTONIX ) 40 MG tablet Take 1 tablet (40 mg total) by mouth daily. (Patient not taking: Reported on 08/17/2023) 30 tablet 3   fluconazole  (DIFLUCAN ) 150 MG tablet Take 1 tablet (150 mg total) by mouth daily. May repeat in 3 days if needed. (Patient not taking: Reported on 08/17/2023) 2 tablet 0   No facility-administered medications prior to visit.    Allergies  Allergen Reactions   Peanut-Containing Drug Products     Ance    Aspirin Itching and Rash    Stomach pains    Review of Systems  Constitutional:  Negative for chills and fever.  Respiratory:  Negative for shortness of breath.   Cardiovascular:  Negative for chest pain and palpitations.  Gastrointestinal:  Negative for abdominal pain, constipation, diarrhea, nausea and vomiting.  Neurological:  Negative for dizziness and focal  weakness.       Objective:    Physical Exam Constitutional:      General: She is not in acute distress.    Appearance: She is not ill-appearing.  Eyes:     Extraocular Movements: Extraocular movements intact.     Conjunctiva/sclera: Conjunctivae normal.  Cardiovascular:     Rate and Rhythm: Normal rate.  Pulmonary:     Effort: Pulmonary effort is normal.  Musculoskeletal:     Cervical back: Normal range of motion and neck supple.  Skin:    General: Skin is warm and dry.  Neurological:     General: No focal deficit present.     Mental Status: She is alert and oriented to person, place, and time.  Psychiatric:        Mood and Affect: Mood normal.        Behavior: Behavior normal.        Thought Content: Thought content normal.      BP (!) 122/90 (BP Location: Left Arm, Patient Position: Sitting)   Pulse 74   Temp (!) 97.5 F (36.4 C) (Temporal)   Ht 5\' 3"  (1.6 m)   Wt 190 lb (86.2 kg)   SpO2 98%   BMI 33.66 kg/m  Wt Readings from Last 3 Encounters:  08/17/23 190 lb (86.2 kg)  05/31/23 195 lb (88.5 kg)  05/11/23 193 lb (87.5 kg)       Assessment & Plan:   Problem List Items Addressed This Visit   None Visit Diagnoses       Travel advice encounter    -  Primary     Need for malaria prophylaxis       Relevant Medications   atovaquone -proguanil (MALARONE ) 250-100 MG TABS tablet      Reviewed immunizations and she is UTD on Hep A vaccines as well as Tdap.  Malarone  prescribed and counseling on how to take the medication and potential side effects. Recommend mosquito protection.   I have discontinued Norma Egle's fluconazole . I am also having her start on atovaquone -proguanil and fluconazole . Additionally, I am having her maintain her multivitamin and pantoprazole .  Meds ordered this encounter  Medications   atovaquone -proguanil (MALARONE ) 250-100 MG TABS tablet    Sig: Take 1 tablet by mouth daily. Take with food.  Repeat dose if vomiting occurs w/in  ; Start 2 days before exposure. Stop 7 days after exposure.    Dispense:  30 tablet    Refill:  0    Supervising Provider:   Bambi Lever A [[id number]]   fluconazole  (DIFLUCAN ) 150 MG tablet    Sig: Take 1 tablet (150 mg total) by mouth  once for 1 dose.    Dispense:  1 tablet    Refill:  0    Supervising Provider:   Bambi Lever A [[id number]]

## 2023-08-23 ENCOUNTER — Ambulatory Visit (INDEPENDENT_AMBULATORY_CARE_PROVIDER_SITE_OTHER): Admitting: Gastroenterology

## 2023-08-23 ENCOUNTER — Encounter: Payer: Self-pay | Admitting: Gastroenterology

## 2023-08-23 VITALS — BP 110/82 | HR 68 | Ht 63.0 in | Wt 190.4 lb

## 2023-08-23 DIAGNOSIS — R1013 Epigastric pain: Secondary | ICD-10-CM | POA: Diagnosis not present

## 2023-08-23 DIAGNOSIS — R112 Nausea with vomiting, unspecified: Secondary | ICD-10-CM

## 2023-08-23 DIAGNOSIS — R1310 Dysphagia, unspecified: Secondary | ICD-10-CM | POA: Diagnosis not present

## 2023-08-23 DIAGNOSIS — G8929 Other chronic pain: Secondary | ICD-10-CM | POA: Diagnosis not present

## 2023-08-23 DIAGNOSIS — K5904 Chronic idiopathic constipation: Secondary | ICD-10-CM | POA: Diagnosis not present

## 2023-08-23 DIAGNOSIS — R1319 Other dysphagia: Secondary | ICD-10-CM

## 2023-08-23 DIAGNOSIS — Z8619 Personal history of other infectious and parasitic diseases: Secondary | ICD-10-CM

## 2023-08-23 MED ORDER — PANTOPRAZOLE SODIUM 20 MG PO TBEC
20.0000 mg | DELAYED_RELEASE_TABLET | Freq: Every day | ORAL | 0 refills | Status: DC
Start: 2023-08-23 — End: 2023-09-18

## 2023-08-23 NOTE — Progress Notes (Signed)
 Chief Complaint: epigastric pain, nausea, vomiting, dysphagia Primary GI Doctor:Dr. Rosaline Coma  HPI:  Patient is a  28  year old female patient with past medical history of iron deficiency anemia, h pylori who was referred to me by Abram Abraham, NP-C on 05/31/23 for a complaint of epigastric pain, nausea, vomiting, and dysphagia  .    03/07/2022 patient last seen in GI office by Dr. Rosaline Coma. Starting at the end of 08/2021 after she returned from [place], she started feeling a lot of reflux when she eats. She would have N&V. Her appetite has been gone because she doesn't want to experience the reflux sensation after eating. She was found to have a positive H pylori antibody and has never been treated for H pylori in the past. She was treated with pantoprazole , which has been working to keep her symptoms under control, but she wants to solve the problem, not just take medications to suppress her symptoms. She didn't take the pantoprazole  yesterday or today. She will have N&V if she doesn't take the PPI. She has lost about 5-10 lbs since her GI symptoms started. Denies melena, hematochezia, changes in bowel habits, diarrhea, or constipation. Denies dysphagia over the long term. Has some discomfort in th epigastric area. Denies family history of GI issues.  Diatherix H pylori stool antigen positive and prescribed - Tetracycline  500 mg QID x 14 days - Flagyl  250 mg QID x 14 days - Bismuth subsalicylate 524 mg QID x 14 days - PPI BID x 14 days   Interval History    Patient reports since January she has had epigastric pain with nausea and vomiting. She states initially was a daily thing, now occurs a few times a week. She did take pantoprazole  for short time and felt it helped, no longer taking. She is also having esophageal dysphagia with solids few times a month. She does admit to eating spicy food. She has occasional pyrosis with regurgitation. Appetite good.      She has bowel movement every 2-3 days.  She takes herbal tea smooth move and it helps regulate her bowels. She notes a lot of bloating with certain foods such as plantains. No blood in stool. She admits she has a lot of stress.  Nonsmoker. No alcohol use.    She was treated for H pylori back in 11/23 and never tested for cure.   She takes OTC Motrin  400mg  on average twice a month for menstrual cramps.  Patient on oral iron for iron deficiency she states she has had for long time.   She is going to [place] on [place] 1st-18th.  Wt Readings from Last 3 Encounters:  08/23/23 190 lb 6.4 oz (86.4 kg)  08/17/23 190 lb (86.2 kg)  05/31/23 195 lb (88.5 kg)    Past Medical History:  Diagnosis Date   Anemia    Past Surgical History:  Procedure Laterality Date   HERNIA REPAIR     Current Outpatient Medications  Medication Sig Dispense Refill   Multiple Vitamin (MULTIVITAMIN) capsule Take 1 capsule by mouth daily.     pantoprazole  (PROTONIX ) 20 MG tablet Take 1 tablet (20 mg total) by mouth daily. 90 tablet 0   atovaquone -proguanil (MALARONE ) 250-100 MG TABS tablet Take 1 tablet by mouth daily. Take with food.  Repeat dose if vomiting occurs w/in ; Start 2 days before exposure. Stop 7 days after exposure. (Patient not taking: Reported on 08/23/2023) 30 tablet 0   No current facility-administered medications for this visit.  Allergies as of 08/23/2023 - Review Complete 08/23/2023  Allergen Reaction Noted   Peanut-containing drug products  12/24/2010   Aspirin Itching and Rash 12/24/2010    Family History  Problem Relation Age of Onset   Diabetes Mother    Hypertension Mother    Colon cancer Neg Hx    Liver disease Neg Hx    Esophageal cancer Neg Hx    Review of Systems:    Constitutional: No weight loss, fever, chills, weakness or fatigue HEENT: Eyes: No change in vision               Ears, Nose, Throat:  No change in hearing or congestion Skin: No rash or itching Cardiovascular: No chest pain, chest pressure or  palpitations   Respiratory: No SOB or cough Gastrointestinal: See HPI and otherwise negative Genitourinary: No dysuria or change in urinary frequency Neurological: No headache, dizziness or syncope Musculoskeletal: No new muscle or joint pain Hematologic: No bleeding or bruising Psychiatric: No history of depression or anxiety    Physical Exam:  Vital signs: BP 110/82 (Cuff Size: Normal)   Pulse 68   Ht 5\' 3"  (1.6 m)   Wt 190 lb 6.4 oz (86.4 kg)   LMP 07/25/2023 (Exact Date)   BMI 33.73 kg/m   Constitutional:   Pleasant  female appears to be in NAD, Well developed, Well nourished, alert and cooperative Throat: Oral cavity and pharynx without inflammation, swelling or lesion.  Respiratory: Respirations even and unlabored. Lungs clear to auscultation bilaterally.   No wheezes, crackles, or rhonchi.  Cardiovascular: Normal S1, S2. Regular rate and rhythm. No peripheral edema, cyanosis or pallor.  Gastrointestinal:  Soft, nondistended, epigastric tenderness. No rebound or guarding. Normal bowel sounds. No appreciable masses or hepatomegaly. Rectal:  Not performed.  Msk:  Symmetrical without gross deformities. Without edema, no deformity or joint abnormality.  Neurologic:  Alert and  oriented x4;  grossly normal neurologically.  Skin:   Dry and intact without significant lesions or rashes. Psychiatric: Oriented to person, place and time. Demonstrates good judgement and reason without abnormal affect or behaviors.  RELEVANT LABS AND IMAGING: CBC    Latest Ref Rng & Units 05/09/2023    6:17 PM 10/06/2022   11:53 AM 04/13/2022    6:36 PM  CBC  WBC 4.0 - 10.5 K/uL 3.5  3.3  3.8   Hemoglobin 12.0 - 15.0 g/dL 78.2  95.6  21.3   Hematocrit 36.0 - 46.0 % 36.8  32.9  37.2   Platelets 150 - 400 K/uL 217  280.0  239      CMP     Latest Ref Rng & Units 05/09/2023    6:17 PM 10/06/2022   11:53 AM 04/13/2022    6:36 PM  CMP  Glucose 70 - 99 mg/dL 87  80  85   BUN 6 - 20 mg/dL 10  12  7     Creatinine 0.44 - 1.00 mg/dL 0.86  5.78  4.69   Sodium 135 - 145 mmol/L 136  137  136   Potassium 3.5 - 5.1 mmol/L 4.0  4.2  4.2   Chloride 98 - 111 mmol/L 102  104  102   CO2 22 - 32 mmol/L 26  28  22    Calcium 8.9 - 10.3 mg/dL 9.4  8.9  9.3   Total Protein 6.5 - 8.1 g/dL 6.9  7.1  7.1   Total Bilirubin 0.0 - 1.2 mg/dL 0.4  0.4  <6.2   Alkaline Phos  38 - 126 U/L 41  48  48   AST 15 - 41 U/L 27  25  24    ALT 0 - 44 U/L 24  21  15       Lab Results  Component Value Date   TSH 2.16 10/06/2022     Assessment: Encounter Diagnoses  Name Primary?   Abdominal pain, chronic, epigastric Yes   Nausea and vomiting, unspecified vomiting type    History of Helicobacter pylori infection    Chronic idiopathic constipation    Esophageal dysphagia     28 year old female patient wit chronic epigastric pain with nausea and vomiting that increased in severity and frequency in January. Hx of h pylori, never tested for eradication, will check today. She states PPI therapy does help with her symptoms. Will restart the pantoprazole  20 mg po daily along with GERD diet. For the esophageal dysphagia will schedule upper GI endoscopy with possible dilatation.  Plan: - Order Diatherix H pylori stool antigen for eradication -Start Pantoprazole  20 mg po daily  -Strict GERD diet, no late meals 3-4 hours before laying down -Schedule a EGD with possible dilatation with Dr. Rosaline Coma in [facility]. The risks and benefits of EGD with possible biopsies and esophageal dilation were discussed with the patient who agrees to proceed.    Thank you for the courtesy of this consult. Please call me with any questions or concerns.   Kristiann Noyce, FNP-C [facility] 08/23/2023, 4:32 PM  Cc: Henson, Vickie L, NP-C

## 2023-08-23 NOTE — Patient Instructions (Addendum)
 Start Pantoprazole  20mg  po daily 1 tablet 30-45 minutes before breakfast.  Your provider has ordered "Diatherix" stool testing for you. You have received a kit from our office today containing all necessary supplies to complete this test. Please carefully read the stool collection instructions provided in the kit before opening the accompanying materials. In addition, be sure there is a label providing your full name and date of birth on the "puritan opti-swab" tube that is supplied in the kit (if you do not see a label with this information on your test tube, please make us  aware before test collection!). After completing the test, you should secure the purtian tube into the specimen biohazard bag. The [facility] E-Req sheet (including date and time of specimen collection) should be placed into the outside pocket of the specimen biohazard bag and returned to the [place] lab (basement floor of [facility]) within 3 days of collection. Please make sure to give the specimen to a staff member at the lab. DO NOT leave the specimen on the counter.   If the specimen date and time (can be found in the upper right boxed portion of the sheet) are not filled out on the E-Req sheet, the test will NOT be performed.   You have been scheduled for an endoscopy. Please follow written instructions given to you at your visit today.  If you use inhalers (even only as needed), please bring them with you on the day of your procedure.  If you take any of the following medications, they will need to be adjusted prior to your procedure:   DO NOT TAKE 7 DAYS PRIOR TO TEST- Trulicity (dulaglutide) Ozempic, Wegovy (semaglutide) Mounjaro (tirzepatide) Bydureon Bcise (exanatide extended release)  DO NOT TAKE 1 DAY PRIOR TO YOUR TEST Rybelsus (semaglutide) Adlyxin (lixisenatide) Victoza (liraglutide) Byetta  (exanatide) ___________________________________________________________________________  Due to recent changes in healthcare laws, you may see the results of your imaging and laboratory studies on MyChart before your provider has had a chance to review them.  We understand that in some cases there may be results that are confusing or concerning to you. Not all laboratory results come back in the same time frame and the provider may be waiting for multiple results in order to interpret others.  Please give us  48 hours in order for your provider to thoroughly review all the results before contacting the office for clarification of your results.  _______________________________________________________  If your blood pressure at your visit was 140/90 or greater, please contact your primary care physician to follow up on this.  _______________________________________________________  If you are age 41 or older, your body mass index should be between 23-30. Your Body mass index is 33.73 kg/m. If this is out of the aforementioned range listed, please consider follow up with your Primary Care Provider.  If you are age 69 or younger, your body mass index should be between 19-25. Your Body mass index is 33.73 kg/m. If this is out of the aformentioned range listed, please consider follow up with your Primary Care Provider.   ________________________________________________________  The [facility] providers would like to encourage you to use MYCHART to communicate with providers for non-urgent requests or questions.  Due to long hold times on the telephone, sending your provider a message by [facility] may be a faster and more efficient way to get a response.  Please allow 48 business hours for a response.  Please remember that this is for non-urgent requests.  _______________________________________________________  Thank you for trusting me with your gastrointestinal care!   Dyanna Glasgow, NP

## 2023-08-23 NOTE — Progress Notes (Signed)
 I agree with the assessment and plan as outlined by Ellen Roberts.

## 2023-08-30 ENCOUNTER — Encounter: Admitting: Internal Medicine

## 2023-09-12 ENCOUNTER — Encounter: Payer: Self-pay | Admitting: Internal Medicine

## 2023-09-18 ENCOUNTER — Ambulatory Visit (HOSPITAL_COMMUNITY)
Admission: RE | Admit: 2023-09-18 | Discharge: 2023-09-18 | Disposition: A | Discharge status: Home / Self Care | Source: Ambulatory Visit

## 2023-09-18 ENCOUNTER — Encounter (HOSPITAL_COMMUNITY): Payer: Self-pay

## 2023-09-18 ENCOUNTER — Other Ambulatory Visit: Payer: Self-pay

## 2023-09-18 VITALS — BP 113/68 | HR 73 | Temp 98.1°F | Resp 18

## 2023-09-18 DIAGNOSIS — L0501 Pilonidal cyst with abscess: Secondary | ICD-10-CM

## 2023-09-18 MED ORDER — SULFAMETHOXAZOLE-TRIMETHOPRIM 800-160 MG PO TABS: 1.0000 | ORAL_TABLET | Freq: Two times a day (BID) | ORAL | 0 refills | Status: AC

## 2023-09-18 NOTE — Discharge Instructions (Signed)
 Stop taking clindamycin and start taking Bactrim  twice daily for 7 days. Apply warm compresses and do warm water soaks in the tub to help decrease swelling and promote drainage. Alternate between 650 mg of Tylenol  and 400 to 600 mg of ibuprofen  every 6-8 hours as needed for pain. Return here if you have increased pain, swelling, spreading of redness, or lots of puslike drainage from the area.

## 2023-09-18 NOTE — ED Provider Notes (Signed)
 [facility]    CSN: [id number] Arrival date & time: 09/18/23  1509      History   Chief Complaint Chief Complaint  Patient presents with   Abscess    Appt 1500    HPI Ellen Roberts is a 28 y.o. female.   Patient presents with concerns for possible abscess between buttocks x 4 days.  Patient states that 4 days ago she started to notice some pain and now has had progressively worsening pain and swelling.  Patient recently traveled to [place] and was given a prescription for clindamycin while there for this.  Patient states that the area has continued to become worse versus improving.  The history is provided by the patient and medical records.  Abscess   Past Medical History:  Diagnosis Date   Anemia     Patient Active Problem List   Diagnosis Date Noted   Dyspepsia 05/31/2023   Skin tags, multiple acquired 05/31/2023    Past Surgical History:  Procedure Laterality Date   HERNIA REPAIR      OB History     Gravida  0   Para      Term      Preterm      AB      Living         SAB      IAB      Ectopic      Multiple      Live Births               Home Medications    Prior to Admission medications   Medication Sig Start Date End Date Taking? Authorizing Provider  Multiple Vitamin (MULTIVITAMIN) capsule Take 1 capsule by mouth daily.   Yes [provider]  sulfamethoxazole -trimethoprim  (BACTRIM  DS) 800-160 MG tablet Take 1 tablet by mouth 2 (two) times daily for 7 days. 09/18/23 09/25/23 Yes Karon Packer, NP  UNABLE TO FIND Med Name: antibiotic from [place], and pain suppository from [place]   Yes [provider]    Family History Family History  Problem Relation Age of Onset   Diabetes Mother    Hypertension Mother    Colon cancer Neg Hx    Liver disease Neg Hx    Esophageal cancer Neg Hx     Social History Social History   Tobacco Use   Smoking status: Never    Passive exposure: Never   Smokeless  tobacco: Never  Vaping Use   Vaping status: Never Used  Substance Use Topics   Alcohol use: Not Currently   Drug use: Never     Allergies   Peanut-containing drug products and Aspirin   Review of Systems Review of Systems  Per HPI  Physical Exam Triage Vital Signs ED Triage Vitals  Encounter Vitals Group     BP 09/18/23 1534 113/68     Systolic BP Percentile --      Diastolic BP Percentile --      Pulse Rate 09/18/23 1534 73     Resp 09/18/23 1534 18     Temp 09/18/23 1534 98.1 F (36.7 C)     Temp src --      SpO2 09/18/23 1534 99 %     Weight --      Height --      Head Circumference --      Peak Flow --      Pain Score 09/18/23 1535 8     Pain Loc --  Pain Education --      Exclude from Growth Chart --    No data found.  Updated Vital Signs BP 113/68   Pulse 73   Temp 98.1 F (36.7 C)   Resp 18   LMP 08/29/2023 (Exact Date)   SpO2 99%   Visual Acuity Right Eye Distance:   Left Eye Distance:   Bilateral Distance:    Right Eye Near:   Left Eye Near:    Bilateral Near:     Physical Exam Vitals and nursing note reviewed.  Constitutional:      General: She is awake. She is not in acute distress.    Appearance: Normal appearance. She is well-developed and well-groomed. She is not ill-appearing.  Skin:    General: Skin is warm and dry.     Findings: Abscess present.       Neurological:     Mental Status: She is alert.  Psychiatric:        Behavior: Behavior is cooperative.      UC Treatments / Results  Labs (all labs ordered are listed, but only abnormal results are displayed) Labs Reviewed - No data to display  EKG   Radiology No results found.  Procedures Procedures (including critical care time)  Medications Ordered in UC Medications - No data to display  Initial Impression / Assessment and Plan / UC Course  I have reviewed the triage vital signs and the nursing notes.  Pertinent labs & imaging results that were  available during my care of the patient were reviewed by me and considered in my medical decision making (see chart for details).     Patient is well-appearing. Upon assessment there is approximately 2x2 cm induration noted to each side of the gluteal cleft in the pilonidal region.  Deferred I&D at this time due to significant induration without any fluctuance present.  Told patient to discontinue clindamycin and start taking Bactrim .  Recommended applying warm compresses to help promote drainage.  Recommended Tylenol  ibuprofen  as needed for pain.  Discussed return precautions. Final Clinical Impressions(s) / UC Diagnoses   Final diagnoses:  Pilonidal abscess     Discharge Instructions      Stop taking clindamycin and start taking Bactrim  twice daily for 7 days. Apply warm compresses and do warm water soaks in the tub to help decrease swelling and promote drainage. Alternate between 650 mg of Tylenol  and 400 to 600 mg of ibuprofen  every 6-8 hours as needed for pain. Return here if you have increased pain, swelling, spreading of redness, or lots of puslike drainage from the area.  ED Prescriptions     Medication Sig Dispense Auth. Provider   sulfamethoxazole -trimethoprim  (BACTRIM  DS) 800-160 MG tablet Take 1 tablet by mouth 2 (two) times daily for 7 days. 14 tablet Levora Reas A, NP      PDMP not reviewed this encounter.   Levora Reas A, NP 09/18/23 [phone]

## 2023-09-18 NOTE — ED Triage Notes (Signed)
 C/O possible abscess between buttocks onset 4 days ago with increasing pain and size.  Pt was seen in [place] (arrived here this AM) and was given abx and suppository for pain, but states getting worse.

## 2023-09-20 ENCOUNTER — Encounter: Payer: Self-pay | Admitting: Emergency Medicine

## 2023-09-20 ENCOUNTER — Encounter: Admitting: Internal Medicine

## 2023-09-20 ENCOUNTER — Telehealth: Payer: Self-pay | Admitting: Internal Medicine

## 2023-09-20 ENCOUNTER — Ambulatory Visit: Payer: Self-pay

## 2023-09-20 ENCOUNTER — Ambulatory Visit (INDEPENDENT_AMBULATORY_CARE_PROVIDER_SITE_OTHER): Admitting: Emergency Medicine

## 2023-09-20 VITALS — BP 112/72 | HR 86 | Temp 98.7°F | Ht 63.0 in | Wt 196.0 lb

## 2023-09-20 DIAGNOSIS — L0501 Pilonidal cyst with abscess: Secondary | ICD-10-CM | POA: Diagnosis not present

## 2023-09-20 MED ORDER — HYDROCODONE-ACETAMINOPHEN 10-325 MG PO TABS: 1.0000 | ORAL_TABLET | Freq: Three times a day (TID) | ORAL | 0 refills | Status: AC | PRN

## 2023-09-20 NOTE — Assessment & Plan Note (Signed)
 Large tender abscess.  Draining at this moment. Continue and finish Bactrim  DS twice a day Pain management discussed Recommend Tylenol  for mild to moderate pain and Norco for moderate to severe pain Needs to be evaluated by general surgeon for definitive treatment. Urgent referral placed today ED precautions given Advised to also follow-up with PCP

## 2023-09-20 NOTE — Telephone Encounter (Signed)
 Good Afternoon Dr. Rosaline Coma,  I called this patient at 1:30 pm  today. I left a message for her to call if she was running late or if she needed to reschedule.  I will NO SHOW her.  [employer]

## 2023-09-20 NOTE — Telephone Encounter (Signed)
  Chief Complaint: abscess Symptoms: pain and draining Frequency: x 1 week Pertinent Negatives: Patient denies fever Disposition: [] ED /[] Urgent Care (no appt availability in office) / [x] Appointment(In office/virtual)/ []  [facility]/ [] Home Care/ [] Refused Recommended Disposition /[] [facility]/ []  Follow-up with PCP Additional Notes: pt states that she has a abscess and was placed on antibotics for it. States that on yesterday it started draining on it own. States that it is painful. States pain is 9/10.  States now draining blood and pus.   Copied from CRM [id number]. Topic: Clinical - Red Word Triage >> Sep 20, 2023  8:09 AM Oddis Bench wrote: Red Word that prompted transfer to Nurse Triage: Patient has abscess and went to urgent care and got antibotic ointment, she is stating that the abscess burst, its very painful and oozing it is on the top of the buttocks. Reason for Disposition  SEVERE pain (e.g., excruciating)  Answer Assessment - Initial Assessment Questions 1. APPEARANCE: "What does the boil (abscess) look like?"      draining 2. LOCATION: "Where is the boil located?"      buttocks 3. NUMBER: "How many boils are there?"      1  5. ONSET: "When did the boil start?"     About a week ago 6. PAIN: "Is there any pain?" If Yes, ask: "How bad is the pain?"  (Scale 1-10; or mild, moderate, severe)     severe 7. FEVER: "Do you have a fever?" If Yes, ask: "What is it, how was it measured, and when did it start?"      no 8. TREATMENT: "What treatment did you get or are you getting for the boil?" (e.g., I&D, antibiotics, moist heat)     Antibiotics and cream 9. OTHER SYMPTOMS: "Do you have any other symptoms?" (e.g., rash elsewhere on body, shaking chills, spreading redness of nearby skin or red streaks, weakness)      chills  Protocols used: Boil (Skin Abscess) on Treatment Follow-up Call-A-AH

## 2023-09-20 NOTE — Progress Notes (Signed)
 Ellen Roberts 28 y.o.   Chief Complaint  Patient presents with   Cyst    Patient states it been there since last Thursday, at the top of her butt crack, very painful and draining. She did go to urgent care and they gave her abx and it was not draining yet, it wasn't till she put  neosporin on it is when it started to drain.     HISTORY OF PRESENT ILLNESS: This is a 28 y.o. female complaining of draining cyst to gluteal area Went to urgent care center 09/18/2023 and was started on Bactrim .  Abscess was not draining then. Started draining last night.  Complaining of increasing pain.    HPI   Prior to Admission medications   Medication Sig Start Date End Date Taking? Authorizing Provider  fluconazole  (DIFLUCAN ) 150 MG tablet Take 150 mg by mouth once. 08/17/23  Yes [provider]  sulfamethoxazole -trimethoprim  (BACTRIM  DS) 800-160 MG tablet Take 1 tablet by mouth 2 (two) times daily for 7 days. 09/18/23 09/25/23 Yes Levora Reas A, NP  Multiple Vitamin (MULTIVITAMIN) capsule Take 1 capsule by mouth daily. Patient not taking: Reported on 09/20/2023    [provider]  UNABLE TO FIND Med Name: antibiotic from [place], and pain suppository from [place] Patient not taking: Reported on 09/20/2023    [provider]    Allergies  Allergen Reactions   Peanut-Containing Drug Products     Ance    Aspirin Itching and Rash    Stomach pains    Patient Active Problem List   Diagnosis Date Noted   Dyspepsia 05/31/2023   Skin tags, multiple acquired 05/31/2023    Past Medical History:  Diagnosis Date   Anemia     Past Surgical History:  Procedure Laterality Date   HERNIA REPAIR      Social History   Socioeconomic History   Marital status: Married    Spouse name: Not on file   Number of children: 0   Years of education: Not on file   Highest education level: Master's degree (e.g., MA, MS, MEng, MEd, MSW, MBA)  Occupational History   Occupation: full  time student  Tobacco Use   Smoking status: Never    Passive exposure: Never   Smokeless tobacco: Never  Vaping Use   Vaping status: Never Used  Substance and Sexual Activity   Alcohol use: Not Currently   Drug use: Never   Sexual activity: Yes    Birth control/protection: Condom  Other Topics Concern   Not on file  Social History Narrative   Not on file   Social Drivers of Health   Financial Resource Strain: Low Risk  (05/30/2023)   Overall Financial Resource Strain (CARDIA)    Difficulty of Paying Living Expenses: Not very hard  Food Insecurity: Patient Declined (05/30/2023)   Hunger Vital Sign    Worried About Running Out of Food in the Last Year: Patient declined    Ran Out of Food in the Last Year: Patient declined  Transportation Needs: No Transportation Needs (05/30/2023)   PRAPARE - Administrator, Civil Service (Medical): No    Lack of Transportation (Non-Medical): No  Physical Activity: Insufficiently Active (05/30/2023)   Exercise Vital Sign    Days of Exercise per Week: 3 days    Minutes of Exercise per Session: 20 min  Stress: No Stress Concern Present (05/30/2023)   [employer] of Occupational Health - Occupational Stress Questionnaire    Feeling of Stress :  Only a little  Social Connections: Socially Integrated (05/30/2023)   Social Connection and Isolation Panel [NHANES]    Frequency of Communication with Friends and Family: Three times a week    Frequency of Social Gatherings with Friends and Family: Three times a week    Attends Religious Services: More than 4 times per year    Active Member of Clubs or Organizations: Yes    Attends Engineer, structural: More than 4 times per year    Marital Status: Married  Catering manager Violence: Not on file    Family History  Problem Relation Age of Onset   Diabetes Mother    Hypertension Mother    Colon cancer Neg Hx    Liver disease Neg Hx    Esophageal cancer Neg Hx      Review  of Systems  Constitutional: Negative.  Negative for chills and fever.  HENT: Negative.  Negative for congestion and sore throat.   Respiratory: Negative.  Negative for cough and shortness of breath.   Cardiovascular: Negative.  Negative for chest pain and palpitations.  Gastrointestinal:  Negative for abdominal pain, nausea and vomiting.  Genitourinary: Negative.  Negative for dysuria and hematuria.  Skin: Negative.  Negative for rash.  Neurological: Negative.  Negative for dizziness and headaches.  All other systems reviewed and are negative.   Vitals:   09/20/23 1420  BP: 112/72  Pulse: 86  Temp: 98.7 F (37.1 C)  SpO2: 99%    Physical Exam Vitals reviewed.  Constitutional:      Appearance: Normal appearance.  HENT:     Head: Normocephalic.  Eyes:     Extraocular Movements: Extraocular movements intact.  Cardiovascular:     Rate and Rhythm: Normal rate.  Pulmonary:     Effort: Pulmonary effort is normal.  Skin:    General: Skin is warm and dry.     Comments: Large pilonidal abscess, draining.  Very tender to palpation  Neurological:     Mental Status: She is alert and oriented to person, place, and time.  Psychiatric:        Mood and Affect: Mood normal.        Behavior: Behavior normal.      ASSESSMENT & PLAN: A total of 33 minutes was spent with the patient and counseling/coordination of care regarding preparing for this visit, review of most recent office visit notes, review of any chronic medical condition under management, review of all medications, diagnosis of pilonidal abscess and management, need for surgical evaluation, need for antibiotics, prognosis, ED precautions, documentation and need for follow-up.  Problem List Items Addressed This Visit       Musculoskeletal and Integument   Pilonidal abscess - Primary   Large tender abscess.  Draining at this moment. Continue and finish Bactrim  DS twice a day Pain management discussed Recommend Tylenol  for  mild to moderate pain and Norco for moderate to severe pain Needs to be evaluated by general surgeon for definitive treatment. Urgent referral placed today ED precautions given Advised to also follow-up with PCP      Relevant Medications   HYDROcodone-acetaminophen  (NORCO) 10-325 MG tablet   Other Relevant Orders   Ambulatory referral to General Surgery   Patient Instructions  Pilonidal Cyst  A pilonidal cyst is a fluid-filled sac that forms under the skin near the tailbone, at the top of the crease of the buttocks (pilonidal area). Cysts that are small and not infected may not cause any problems. Cysts that become  irritated or infected may grow and fill with pus. An infected cyst is called an abscess. A pilonidal abscess may cause pain and swelling. It may need to be drained or removed. What are the causes? The cause of this condition is not always known. In some cases, it may be caused by a hair that grows into your skin (ingrown hair). What increases the risk? You are more likely to develop this condition if: You are female. You have lots of hair near the crease of the buttocks. You are overweight. You have a dimple near the crease of the buttocks. You wear tight clothing. You do not bathe or shower often. You sit for long periods of time. What are the signs or symptoms? Symptoms of this condition may include pain, swelling, redness, and warmth in the pilonidal area. You may also be able to feel a lump near your tailbone if your cyst is big. If your cyst becomes infected, symptoms may include: Pus or fluid drainage. Fever. Pain, swelling, and redness getting worse. The lump getting bigger. How is this diagnosed? This condition may be diagnosed based on: Your symptoms and medical history. A physical exam. A blood test to check for infection. A test of a pus sample. How is this treated? You may not need any treatment if your cyst does not cause symptoms. If your cyst bothers  you or is infected, you may need a procedure to drain or remove the cyst. Depending on the size, location, and severity of your cyst, your health care provider may: Make an incision in the cyst and drain it (incision anddrainage). Open and drain the cyst, and then stitch the wound so that it stays open while it heals (marsupialization). You will be given instructions about how to care for your open wound while it heals. Remove all or part of the cyst, and then close the wound (cyst removal). You may need to take antibiotics before your procedure. Follow these instructions at home: Medicines Take over-the-counter and prescription medicines only as told by your health care provider. If you were prescribed antibiotics, take them as told by your health care provider. Do not stop taking the antibiotic even if you start to feel better. General instructions Keep the area around the cyst clean and dry. If there is fluid or pus draining from your cyst: Cover the area with a clean bandage (dressing). Wash the area gently with soap and water. Pat the area dry with a clean towel. Do not rub the area because that may cause bleeding. Remove hair from the area around the cyst only if your health care provider tells you to. Do not wear tight pants or sit in one position for long periods at a time. Contact a health care provider if: You have new redness, swelling, or pain. You have a fever. You have severe pain. Summary A pilonidal cyst is a fluid-filled sac that forms under the skin near the tailbone, at the top of the crease of the buttocks (pilonidal area). Cysts that become irritated or infected may grow and fill with pus. An infected cyst is called an abscess. The cause of this condition is not always known. In some cases, it may be caused by a hair that grows into your skin (ingrown hair). You may not need any treatment if your cyst does not cause symptoms. If your cyst bothers you or is infected, you  may need a procedure to drain or remove the cyst. This information is not intended to  replace advice given to you by your health care provider. Make sure you discuss any questions you have with your health care provider. Document Revised: 07/14/2021 Document Reviewed: 07/14/2021 Elsevier Patient Education  2024 Elsevier Inc.    Maryagnes Small, MD [facility]

## 2023-09-20 NOTE — Patient Instructions (Signed)
 Pilonidal Cyst    A pilonidal cyst is a fluid-filled sac that forms under the skin near the tailbone, at the top of the crease of the buttocks (pilonidal area). Cysts that are small and not infected may not cause any problems.  Cysts that become irritated or infected may grow and fill with pus. An infected cyst is called an abscess. A pilonidal abscess may cause pain and swelling. It may need to be drained or removed.  What are the causes?  The cause of this condition is not always known. In some cases, it may be caused by a hair that grows into your skin (ingrown hair).  What increases the risk?  You are more likely to develop this condition if:  You are female.  You have lots of hair near the crease of the buttocks.  You are overweight.  You have a dimple near the crease of the buttocks.  You wear tight clothing.  You do not bathe or shower often.  You sit for long periods of time.  What are the signs or symptoms?  Symptoms of this condition may include pain, swelling, redness, and warmth in the pilonidal area. You may also be able to feel a lump near your tailbone if your cyst is big.  If your cyst becomes infected, symptoms may include:  Pus or fluid drainage.  Fever.  Pain, swelling, and redness getting worse.  The lump getting bigger.  How is this diagnosed?  This condition may be diagnosed based on:  Your symptoms and medical history.  A physical exam.  A blood test to check for infection.  A test of a pus sample.  How is this treated?  You may not need any treatment if your cyst does not cause symptoms. If your cyst bothers you or is infected, you may need a procedure to drain or remove the cyst. Depending on the size, location, and severity of your cyst, your health care provider may:  Make an incision in the cyst and drain it (incision anddrainage).  Open and drain the cyst, and then stitch the wound so that it stays open while it heals (marsupialization). You will be given instructions about how to care for  your open wound while it heals.  Remove all or part of the cyst, and then close the wound (cyst removal).  You may need to take antibiotics before your procedure.  Follow these instructions at home:  Medicines  Take over-the-counter and prescription medicines only as told by your health care provider.  If you were prescribed antibiotics, take them as told by your health care provider. Do not stop taking the antibiotic even if you start to feel better.  General instructions  Keep the area around the cyst clean and dry.  If there is fluid or pus draining from your cyst:  Cover the area with a clean bandage (dressing).  Wash the area gently with soap and water. Pat the area dry with a clean towel. Do not rub the area because that may cause bleeding.  Remove hair from the area around the cyst only if your health care provider tells you to.  Do not wear tight pants or sit in one position for long periods at a time.  Contact a health care provider if:  You have new redness, swelling, or pain.  You have a fever.  You have severe pain.  Summary  A pilonidal cyst is a fluid-filled sac that forms under the skin near the tailbone, at  the top of the crease of the buttocks (pilonidal area).  Cysts that become irritated or infected may grow and fill with pus. An infected cyst is called an abscess.  The cause of this condition is not always known. In some cases, it may be caused by a hair that grows into your skin (ingrown hair).  You may not need any treatment if your cyst does not cause symptoms. If your cyst bothers you or is infected, you may need a procedure to drain or remove the cyst.  This information is not intended to replace advice given to you by your health care provider. Make sure you discuss any questions you have with your health care provider.  Document Revised: 07/14/2021 Document Reviewed: 07/14/2021  Elsevier Patient Education  2024 [employer].

## 2023-11-08 DIAGNOSIS — L811 Chloasma: Secondary | ICD-10-CM | POA: Diagnosis not present

## 2023-11-08 DIAGNOSIS — L918 Other hypertrophic disorders of the skin: Secondary | ICD-10-CM | POA: Diagnosis not present

## 2023-11-08 DIAGNOSIS — L658 Other specified nonscarring hair loss: Secondary | ICD-10-CM | POA: Diagnosis not present

## 2023-12-20 ENCOUNTER — Ambulatory Visit (INDEPENDENT_AMBULATORY_CARE_PROVIDER_SITE_OTHER): Admitting: Radiology

## 2023-12-20 ENCOUNTER — Ambulatory Visit
Admission: RE | Admit: 2023-12-20 | Discharge: 2023-12-20 | Disposition: A | Discharge status: Home / Self Care | Source: Ambulatory Visit

## 2023-12-20 VITALS — BP 112/70 | HR 61 | Temp 97.8°F | Resp 18 | Ht 60.0 in | Wt 188.0 lb

## 2023-12-20 DIAGNOSIS — S93602A Unspecified sprain of left foot, initial encounter: Secondary | ICD-10-CM

## 2023-12-20 DIAGNOSIS — M79672 Pain in left foot: Secondary | ICD-10-CM | POA: Diagnosis not present

## 2023-12-20 HISTORY — DX: Sickle-cell disease without crisis: D57.1

## 2023-12-20 HISTORY — DX: Gastro-esophageal reflux disease without esophagitis: K21.9

## 2023-12-20 MED ORDER — DICLOFENAC SODIUM 50 MG PO TBEC: 50.0000 mg | DELAYED_RELEASE_TABLET | Freq: Two times a day (BID) | ORAL | 1 refills | Status: DC

## 2023-12-20 NOTE — ED Triage Notes (Signed)
 My left foot is swollen with severe pain in the top right with swelling. No injury known.

## 2023-12-20 NOTE — ED Provider Notes (Signed)
 [facility]  Note:  This document was prepared using Dragon voice recognition software and may include unintentional dictation errors.  MRN: [id number] DOB: [birth year]  Subjective:   Ellen Roberts is a 28 y.o. female presenting for left dorsal foot pain and swelling x 1 to 2 days.  Patient denies any known injury.  States that she occasionally has swelling to her foot but usually does not have any pain.  Patient denies any previous trauma or injury.  Has not taken any over-the-counter pain reliever for symptoms.  No current facility-administered medications for this encounter.  Current Outpatient Medications:    clobetasol ointment (TEMOVATE) 0.05 %, Apply 1 Application topically as directed., Disp: , Rfl:    diclofenac  (VOLTAREN ) 50 MG EC tablet, Take 1 tablet (50 mg total) by mouth 2 (two) times daily., Disp: 30 tablet, Rfl: 1   Multiple Vitamin (MULTIVITAMIN PO), Take 1 capsule by mouth daily., Disp: , Rfl:    fluconazole  (DIFLUCAN ) 150 MG tablet, Take 150 mg by mouth once., Disp: , Rfl:    Multiple Vitamin (MULTIVITAMIN) capsule, Take 1 capsule by mouth daily. (Patient not taking: Reported on 09/20/2023), Disp: , Rfl:    UNABLE TO FIND, Med Name: antibiotic from [place], and pain suppository from [place] (Patient not taking: Reported on 09/20/2023), Disp: , Rfl:    Allergies  Allergen Reactions   Peanut-Containing Drug Products     Ance    Aspirin Itching and Rash    Stomach pains    Past Medical History:  Diagnosis Date   Anemia    GERD (gastroesophageal reflux disease)    Sickle cell anemia (HCC) Trait     Past Surgical History:  Procedure Laterality Date   HERNIA REPAIR      Family History  Problem Relation Age of Onset   Diabetes Mother    Hypertension Mother    Colon cancer Neg Hx    Liver disease Neg Hx    Esophageal cancer Neg Hx     Social History   Tobacco Use   Smoking status: Never    Passive exposure: Never   Smokeless tobacco:  Never  Vaping Use   Vaping status: Never Used  Substance Use Topics   Alcohol use: Not Currently   Drug use: Never    ROS Refer to HPI for ROS details.  Objective:    Vitals: BP 112/70 (BP Location: Right Arm)   Pulse 61   Temp 97.8 F (36.6 C) (Oral)   Resp 18   Ht 5' (1.524 m)   Wt 188 lb (85.3 kg)   LMP 12/20/2023 (Exact Date)   SpO2 98%   BMI 36.72 kg/m   Physical Exam Vitals and nursing note reviewed.  Constitutional:      General: She is not in acute distress.    Appearance: Normal appearance. She is well-developed. She is not ill-appearing or toxic-appearing.  HENT:     Head: Normocephalic and atraumatic.  Cardiovascular:     Rate and Rhythm: Normal rate.  Pulmonary:     Effort: Pulmonary effort is normal. No respiratory distress.  Musculoskeletal:        General: Normal range of motion.     Left foot: Normal range of motion and normal capillary refill. Swelling, tenderness and bony tenderness present. No deformity. Normal pulse.  Skin:    General: Skin is warm and dry.  Neurological:     General: No focal deficit present.     Mental Status: She is alert and  oriented to person, place, and time.  Psychiatric:        Mood and Affect: Mood normal.        Behavior: Behavior normal.     Procedures  No results found for this or any previous visit (from the past 24 hours).  Assessment and Plan :     Discharge Instructions       1. Foot sprain, left, initial encounter (Primary) - DG Foot Complete Left x-ray completed in UC shows no acute fracture or dislocation - Apply ace wrap in UC for compression and protection of left foot - AMB referral to sports medicine for follow-up evaluation of left foot sprain. - diclofenac  (VOLTAREN ) 50 MG EC tablet; Take 1 tablet (50 mg total) by mouth 2 (two) times daily.  Dispense: 30 tablet; Refill: 1 -Continue to monitor symptoms for any change in severity if there is any escalation of current symptoms or  development of new symptoms follow-up in ER for further evaluation and management.      Persais Ethridge B Jenipher Havel   Kamari Buch, [place], [place] 12/20/23 [phone]

## 2023-12-20 NOTE — Discharge Instructions (Addendum)
  1. Foot sprain, left, initial encounter (Primary) - DG Foot Complete Left x-ray completed in UC shows no acute fracture or dislocation - Apply ace wrap in UC for compression and protection of left foot - AMB referral to sports medicine for follow-up evaluation of left foot sprain. - diclofenac  (VOLTAREN ) 50 MG EC tablet; Take 1 tablet (50 mg total) by mouth 2 (two) times daily.  Dispense: 30 tablet; Refill: 1 -Continue to monitor symptoms for any change in severity if there is any escalation of current symptoms or development of new symptoms follow-up in ER for further evaluation and management.

## 2023-12-21 ENCOUNTER — Other Ambulatory Visit (HOSPITAL_BASED_OUTPATIENT_CLINIC_OR_DEPARTMENT_OTHER): Payer: Self-pay

## 2023-12-21 ENCOUNTER — Other Ambulatory Visit: Payer: Self-pay

## 2023-12-21 ENCOUNTER — Ambulatory Visit (INDEPENDENT_AMBULATORY_CARE_PROVIDER_SITE_OTHER)

## 2023-12-21 VITALS — BP 100/70 | Ht 60.0 in | Wt 188.0 lb

## 2023-12-21 DIAGNOSIS — M25572 Pain in left ankle and joints of left foot: Secondary | ICD-10-CM

## 2023-12-21 DIAGNOSIS — M7989 Other specified soft tissue disorders: Secondary | ICD-10-CM | POA: Diagnosis not present

## 2023-12-21 MED ORDER — INDOMETHACIN 50 MG PO CAPS
50.0000 mg | ORAL_CAPSULE | Freq: Three times a day (TID) | ORAL | 0 refills | Status: DC
  Filled 2023-12-21: qty 30, 10d supply, fill #0

## 2023-12-21 NOTE — Patient Instructions (Signed)
 Because of inflammation in the top part of your foot which I suspect may be related to gout.  Head upstairs to [address] to get your blood test drawn. I will be in touch with you about the results of this soon.  Start taking the indomethacin  medicine for the inflammation.  You can pick this up downstairs on your way out.  The pharmacy is to the right of the elevators as you exit on the first floor.  I will plan to see you back to make sure things are trending in the right direction.

## 2023-12-21 NOTE — Progress Notes (Signed)
 Subjective:    Patient ID: Ellen Roberts, female    DOB: 28 y.o., 26-Jan-1996   MRN: [id number]  HPI  Chief Complaint: Left ankle/foot pain  Ellen Roberts is a 28 year old female with no contributory past medical history presenting with left ankle/foot pain.  Seen at [facility] urgent care [facility] 12/20/2023 At that time complaining of foot pain/swelling for 1-2 days without inciting injury. Endorses occasional foot swelling with no prior history of trauma. X-rays at that time read as negative for acute fracture. Given Ace wrap which has helped minimally and 30-day supply of Voltaren  50 mg tablets which she has not picked up yet. States this happens often Up to 3 times a month Has happened over the course of many years Reiterates that there was no known inciting event States that mother has had issues similar to this in her right foot.  Similar story with grandmother. Slightly better with good with keeping it elevated and sometimes with compression. Never has been painful though. Still in pain now.     Objective:   Physical Exam Vitals:   12/21/23 1016  BP: 100/70    Left ankle ( compared to normal ) Inspection: Swelling noted.  Pitting edema present over foot.  Mildly increased caliber of ankle compared to right.,  Calcaneovalgus noted which corrects with toe walking.  Normal transverse arches without significant collapse. Palpation: TTP minimally + ATFL, - CFL, - PTFL, - anterior joint line, - navicular, - base of 5th metatarsal, - deltoid, -  tarsal, - metatarsal, - achilles, - plantar fascia,  + Over dorsum of foot. AROM/PROM: Full plantarflexion, dorsiflexion, eversion, inversion. FROM  of the great toe Strength: 5/5 plantarflexion, 5/5 dorsiflexion, 5/5 eversion, 5/5 inversion Special tests: - talar tilt test, - anterior drawer, - proximal squeeze, - calcaneal squeeze, - [place] ankle rules   Complete US  Examination of the Left Ankle: Dorsal ankle/foot: -The  tibiotalar joint appears normal, without effusion, synovial thickening or signs of osteoarthritis. -The talar dome appears normal. -There is an impressive amount of cobblestoning present throughout the dorsum of the foot. -The tibialis anterior, extensor hallucis longus, appear normal.  -Extensor digitorum longus appears to carry a modest amount of hypoechogenic fluid signal around it. -The peroneal nerve and dorsalis pedis artery appear normal. - There is an effusion noted with a small fluid collection in the adjacent soft tissue near the fourth MTP joint.   Impression: - Prominent soft tissue cobblestoning throughout the dorsum of the foot and some of the anterior ankle - Effusion noted at the MTP joint of the fourth digit.  Ultrasound examination performed and interpreted by Redell Robes, DO    I have independently reviewed her outside plain films from [facility] urgent care revealing soft tissue swelling but no acute osseous abnormalities.     Assessment & Plan:   Ellen Roberts is a very pleasant 28 year old female presenting with a peculiar waxing and waning history of swelling in her left foot/ankle/lower leg for many years.  States mother and grandmother have similar phenomenon in their right foot.  A differential but comes to mind for this being her left foot includes May-Thurner syndrome, gout, pseudogout, cellulitis (for this particular episode), or simple venous insufficiency.  Given the appearance of the soft tissues of her left foot on ultrasound with some noted fluid peritendinous layer in the extensor digitorum tendons I suspect there is an underlying inflammatory etiology rather than a strain (particularly in the absence of any known injury).  I will initiate  treatment with indomethacin  50 mg 3 times daily for 10 days and we will see her back in 1 week to reassess.  She does have an effusion in the fourth MTP joint, there does not look to be any sizable amount of fluid for me to tap  today.  We will also obtain a uric acid blood level today and follow-up this result.

## 2023-12-22 ENCOUNTER — Ambulatory Visit: Payer: Self-pay

## 2023-12-22 LAB — URIC ACID: Uric Acid: 4.7 mg/dL (ref 2.6–6.2)

## 2023-12-29 ENCOUNTER — Ambulatory Visit

## 2023-12-29 VITALS — BP 116/78 | Ht 60.0 in | Wt 188.0 lb

## 2023-12-29 DIAGNOSIS — M7989 Other specified soft tissue disorders: Secondary | ICD-10-CM | POA: Diagnosis not present

## 2023-12-29 NOTE — Progress Notes (Signed)
   Subjective:    Patient ID: Ellen Roberts, female    DOB: 28 y.o., 07/20/1995   MRN: [id number]  HPI  Chief Complaint: Acute left ankle pain + chronic intermittent left lower extremity swelling  Patient reports that her pain has resolved Has 3 more days of indomethacin  left Still curious about the swelling in her left leg though and interested in pursuing further testing for this    Objective:   Physical Exam Vitals:   12/29/23 0923  BP: 116/78   Const: appears well, non-toxic, well groomed Psych: affect bright, interactive, smiling EENT: EOMI intact, conjunctiva appear normal Neck: no obvious masses, appears symmetric Resp: non-labored, appears symmetric Neuro: muscle bulk appears normal Skin: no obvious rashes noted  Left foot/ankle: Minimal increased swelling compared to right side (patient reports this is consistent with her baseline).  Gait: Nonantalgic.    Assessment & Plan:   Ellen Roberts returns for follow-up on the mysterious left lower extremity swelling for which I saw her last week.  Fortunately, her pain has resolved with indomethacin .  She has 3 remaining days on her indomethacin  course which I have recommended that she finish.  My suspicion is high that this acute episode may have been related to CPPD or gout.  Recommend she monitor for future flares and if so, will bring patient in urgently and plan to drain affected joint and send fluid analysis looking for crystals.  Given patient's desire to further evaluate her longstanding waxing and waning left lower extremity swelling, I will order duplex ultrasonography as an initial test with direction to specifically evaluate for venous outflow obstruction versus other vascular abnormality.  If inconclusive or unable to evaluate ilio caval vasculature, may obtain CTA or MRA.

## 2023-12-30 DIAGNOSIS — M7989 Other specified soft tissue disorders: Secondary | ICD-10-CM | POA: Insufficient documentation

## 2024-01-03 NOTE — Addendum Note (Signed)
 Addended by: Penny Arrambide A on: 01/03/2024 03:13 PM   Modules accepted: Orders

## 2024-01-16 ENCOUNTER — Ambulatory Visit (INDEPENDENT_AMBULATORY_CARE_PROVIDER_SITE_OTHER): Payer: 59 | Admitting: Dermatology

## 2024-01-16 ENCOUNTER — Encounter: Payer: Self-pay | Admitting: Dermatology

## 2024-01-16 VITALS — BP 105/66 | HR 70

## 2024-01-16 DIAGNOSIS — L700 Acne vulgaris: Secondary | ICD-10-CM

## 2024-01-16 DIAGNOSIS — L680 Hirsutism: Secondary | ICD-10-CM

## 2024-01-16 DIAGNOSIS — L810 Postinflammatory hyperpigmentation: Secondary | ICD-10-CM | POA: Diagnosis not present

## 2024-01-16 MED ORDER — CLINDAMYCIN PHOSPHATE 1 % EX SWAB: 1.0000 | Freq: Every morning | CUTANEOUS | 9 refills | Status: AC

## 2024-01-16 MED ORDER — SPIRONOLACTONE 100 MG PO TABS: 100.0000 mg | ORAL_TABLET | Freq: Every evening | ORAL | 9 refills | Status: AC

## 2024-01-16 MED ORDER — TRETINOIN 0.025 % EX CREA: TOPICAL_CREAM | Freq: Every day | CUTANEOUS | 3 refills | Status: AC

## 2024-01-16 NOTE — Progress Notes (Signed)
   New Patient Visit   Subjective  Ellen Roberts is a 28 y.o. female who presents for the following: Acne with secondary PIH  Patient states she has acne with secondary PIH located at the face that she would like to have examined. Patient reports the areas have been there for several years. She reports the areas are bothersome.Patient rates irritation 3 out of 10. Patient reports she has not previously been treated for these areas. Patient reports her current regimen consist LRP Tolerine facial wash followed by SomeBYMI AHA and BHA Toner then The ordinary Nicinamide solution,. followed by a the simple moisturizer or the LRP with Sunscreen on the days she is outside. She reports she works from home so she doesn't have much sun exposure. Her night routine Shea Moisture black soap facial wash or CeraVE foaming Cleanser, Beauty of [place] Glow serum & simple moisturizer or snail mucin all in one cream. Patient reports she typically has a break out around period. Patient reports she is not actively TRYING TO CONCEIVE OR PREGNANT.   The following portions of the chart were reviewed this encounter and updated as appropriate: medications, allergies, medical history  Review of Systems:  No other skin or systemic complaints except as noted in HPI or Assessment and Plan.  Objective  Well appearing patient in no apparent distress; mood and affect are within normal limits.  A focused examination was performed of the following areas: Face  Relevant exam findings are noted in the Assessment and Plan.           Assessment & Plan   1. 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 No prior prescription treatments attempted. Patient reports normal menstrual cycles and denies PCOS diagnosis. Currently not trying to conceive within the next year, which allows for broader treatment  options. - Plan:    Start spironolactone  100 mg nightly with food    Start clindamycin  topical solution each morning after face washing    Start tretinoin  0.025% cream nightly on Monday, Wednesday, Friday    Reduce tretinoin  to twice weekly if irritation occurs    Use moisturizer and sunscreen after morning routine    Avoid heavy oils on face    Use contraception to prevent pregnancy while on treatment    Anticipate 4-6 months for significant improvement  2. Hyperpigmentation (Post-Inflammatory) - Assessment: Patient reports dark spots, likely post-inflammatory hyperpigmentation secondary to acne. - Plan:    Start Excedrin Gradient Tone topical treatment each morning for dark spots    Contains thiametol to inhibit pigment formation  3. Hirsutism - Assessment: Patient reports unwanted hair growth. No known underlying endocrine disorders, as patient denies PCOS diagnosis and reports normal menstrual cycles. - Plan:    Spironolactone  100 mg nightly will also address unwanted hair growth    No follow-ups on file.  I, Jetta Ager, am acting as Neurosurgeon for [employer], DO.  Documentation: I have reviewed the above documentation for accuracy and completeness, and I agree with the above.  Delon Lenis, DO

## 2024-01-16 NOTE — Patient Instructions (Addendum)
 Date: Tue Jan 16 2024  Hello [place],  Thank you for visiting today. Here is a summary of the key instructions:  - Medications:   - Take spironolactone  100 mg at night with food   - Use clindamycin  swabs in the morning after washing face   - Apply Eucerin Radiant Tone for dark spots after clindamycin    - Use tretinoin  0.025% on Monday, Wednesday, and Friday nights  - Skin Care:   - Use moisturizer and sunscreen after morning treatments   - Avoid heavy oils like olive oil and Vaseline on your face  - Follow-up:   - It may take 4 to 6 months to see significant improvement   - You should notice progress every 4 weeks  - Other Instructions:   - Watch for lightheadedness or dizziness with spironolactone . Stop and inform us  if this occurs   - If tretinoin  causes irritation, reduce use to 2 nights per week   - Use condoms to prevent pregnancy   - Stop all prescribed treatments if you decide to conceive   - Inform us  if you plan to become pregnant, as you'll need a pregnancy-safe regimen  Please reach out if you have any questions or concerns.  Warm regards,  Dr. Delon Lenis [facility]     Important Information   Due to recent changes in healthcare laws, you may see results of your pathology and/or laboratory studies on MyChart before the doctors have had a chance to review them. We understand that in some cases there may be results that are confusing or concerning to you. Please understand that not all results are received at the same time and often the doctors may need to interpret multiple results in order to provide you with the best plan of care or course of treatment. Therefore, we ask that you please give us  2 business days to thoroughly review all your results before contacting the office for clarification. Should we see a critical lab result, you will be contacted sooner.     If You Need Anything After Your Visit   If you have any questions or concerns for your doctor,  please call our main line at [phone]. If no one answers, please leave a voicemail as directed and we will return your call as soon as possible. Messages left after 4 pm will be answered the following business day.    You may also send us  a message via MyChart. We typically respond to MyChart messages within 1-2 business days.  For prescription refills, please ask your pharmacy to contact our office. Our fax number is [phone].  If you have an urgent issue when the clinic is closed that cannot wait until the next business day, you can page your doctor at the number below.     Please note that while we do our best to be available for urgent issues outside of office hours, we are not available 24/7.    If you have an urgent issue and are unable to reach us , you may choose to seek medical care at your doctor's office, retail clinic, urgent care center, or emergency room.   If you have a medical emergency, please immediately call 911 or go to the emergency department. In the event of inclement weather, please call our main line at [phone] for an update on the status of any delays or closures.  Dermatology Medication Tips: Please keep the boxes that topical medications come in in order to help keep track of the instructions  about where and how to use these. Pharmacies typically print the medication instructions only on the boxes and not directly on the medication tubes.   If your medication is too expensive, please contact our office at [phone] or send us  a message through MyChart.    We are unable to tell what your co-pay for medications will be in advance as this is different depending on your insurance coverage. However, we may be able to find a substitute medication at lower cost or fill out paperwork to get insurance to cover a needed medication.    If a prior authorization is required to get your medication covered by your insurance company, please allow us  1-2 business days  to complete this process.   Drug prices often vary depending on where the prescription is filled and some pharmacies may offer cheaper prices.   The website www.goodrx.com contains coupons for medications through different pharmacies. The prices here do not account for what the cost may be with help from insurance (it may be cheaper with your insurance), but the website can give you the price if you did not use any insurance.  - You can print the associated coupon and take it with your prescription to the pharmacy.  - You may also stop by our office during regular business hours and pick up a GoodRx coupon card.  - If you need your prescription sent electronically to a different pharmacy, notify our office through [facility] or by phone at [phone]

## 2024-01-18 ENCOUNTER — Ambulatory Visit (INDEPENDENT_AMBULATORY_CARE_PROVIDER_SITE_OTHER): Admitting: Family Medicine

## 2024-01-18 ENCOUNTER — Encounter: Payer: Self-pay | Admitting: Family Medicine

## 2024-01-18 VITALS — BP 98/70 | HR 86 | Temp 98.6°F | Ht 60.0 in | Wt 194.0 lb

## 2024-01-18 DIAGNOSIS — M25552 Pain in left hip: Secondary | ICD-10-CM | POA: Diagnosis not present

## 2024-01-18 DIAGNOSIS — M542 Cervicalgia: Secondary | ICD-10-CM | POA: Diagnosis not present

## 2024-01-18 DIAGNOSIS — M545 Low back pain, unspecified: Secondary | ICD-10-CM

## 2024-01-18 DIAGNOSIS — G8929 Other chronic pain: Secondary | ICD-10-CM | POA: Diagnosis not present

## 2024-01-18 DIAGNOSIS — M25551 Pain in right hip: Secondary | ICD-10-CM

## 2024-01-18 DIAGNOSIS — T148XXA Other injury of unspecified body region, initial encounter: Secondary | ICD-10-CM | POA: Insufficient documentation

## 2024-01-18 MED ORDER — METHOCARBAMOL 500 MG PO TABS: 500.0000 mg | ORAL_TABLET | Freq: Three times a day (TID) | ORAL | 0 refills | Status: DC | PRN

## 2024-01-18 NOTE — Progress Notes (Signed)
 Acute Office Visit  Subjective:     Patient ID: Ellen Roberts, female    DOB: 1995/08/15, 28 y.o.   MRN: [id number]  No chief complaint on file.   HPI  Discussed the use of AI scribe software for clinical note transcription with the patient, who gave verbal consent to proceed.  History of Present Illness Ellen Roberts is a 28 year old female who presents with hip and back pain.  Bilateral Hip and low back pain - Right-sided hip pain extending up the back without radiation down the legs - Pain duration of at least one year - Pain is most severe in the morning upon waking - No associated numbness or tingling - No use of muscle relaxers in the past - No current use of pain medications; prefers to avoid taking random medications - Significant relief previously achieved after approximately 20 sessions with a chiropractor, but treatment not continued  Functional impact and activity limitations - Walking is used as a form of exercise but is challenging due to pain - Works from home and uses an office chair     ROS Per HPI      Objective:    BP 98/70 (BP Location: Left Arm, Patient Position: Sitting)   Pulse 86   Temp 98.6 F (37 C) (Temporal)   Ht 5' (1.524 m)   Wt 194 lb (88 kg)   LMP 12/20/2023 (Exact Date)   SpO2 100%   BMI 37.89 kg/m    Physical Exam Vitals and nursing note reviewed.  Constitutional:      General: She is not in acute distress.    Comments: Appears uncomfortable  HENT:     Head: Normocephalic and atraumatic.     Right Ear: External ear normal.     Left Ear: External ear normal.  Eyes:     Extraocular Movements: Extraocular movements intact.  Cardiovascular:     Rate and Rhythm: Normal rate and regular rhythm.     Pulses: Normal pulses.  Pulmonary:     Effort: Pulmonary effort is normal.  Musculoskeletal:     Cervical back: Normal range of motion.     Right lower leg: No edema.     Left lower leg: No edema.     Comments: LROM to low  back with standing, sitting, twisting, changing positions. Tenderness to bilateral SI joints, no erythema, no bruising. No obvious deformity.  Lymphadenopathy:     Cervical: No cervical adenopathy.  Skin:    General: Skin is warm and dry.  Neurological:     General: No focal deficit present.     Mental Status: She is alert and oriented to person, place, and time.  Psychiatric:        Mood and Affect: Mood normal.        Thought Content: Thought content normal.     No results found for any visits on 01/18/24.      Assessment & Plan:   Assessment and Plan Assessment & Plan Chronic low back pain without sciatica, Bilateral hip pain, Bilateral posterior Neck Pain, Muscle Strain Chronic localized pain without radiation, worsens in the morning, improves with chiropractic care. No current medication use, open to physical therapy. - Recommend specialized cushion for chair support. - Provide exercises for neck, shoulders, and low back twice daily for 1-2 weeks. - Refer to physical therapy if no improvement after 2 weeks. - Prescribe ibuprofen  or Aleve as needed. - Advise heat therapy for pain relief. - Prescribe muscle relaxers  for nighttime use, option to take half dose initially. - Send referral to [facility] physical therapy. - Follow up as needed after physical therapy.     Orders Placed This Encounter  Procedures   Ambulatory referral to Physical Therapy    Referral Priority:   Routine    Referral Type:   Physical Medicine    Referral Reason:   Specialty Services Required    Requested Specialty:   Physical Therapy    Number of Visits Requested:   1     Meds ordered this encounter  Medications   methocarbamol  (ROBAXIN ) 500 MG tablet    Sig: Take 1 tablet (500 mg total) by mouth every 8 (eight) hours as needed for muscle spasms.    Dispense:  30 tablet    Refill:  0    Return if symptoms worsen or fail to improve.  Corean LITTIE Ku, FNP

## 2024-01-18 NOTE — Patient Instructions (Addendum)
 I have attached exercises to help stretch out your muscles.  I have sent in a muscle relaxer for you to use one tablet as needed for muscle spasms. This medication can make you sleepy. Do not drive until you know how this medication affects you.  May use heat or ice to the area for relief as needed.  May use topical rubs or gels to the area as needed for relief.  Attached subtle stretching exercises to help relieve pain and strengthen muscles.   Follow-up with me for new or worsening symptoms.  I have sent a referral for PT for you today. Someone should be reaching out to get you scheduled.   Follow-up with me for new or worsening symptoms.

## 2024-02-01 ENCOUNTER — Ambulatory Visit (HOSPITAL_BASED_OUTPATIENT_CLINIC_OR_DEPARTMENT_OTHER)
Admission: RE | Admit: 2024-02-01 | Discharge: 2024-02-01 | Disposition: A | Discharge status: Home / Self Care | Source: Ambulatory Visit

## 2024-02-01 DIAGNOSIS — M7989 Other specified soft tissue disorders: Secondary | ICD-10-CM | POA: Diagnosis not present

## 2024-02-16 ENCOUNTER — Ambulatory Visit: Admitting: Family Medicine

## 2024-02-16 ENCOUNTER — Ambulatory Visit: Payer: Self-pay | Admitting: Family Medicine

## 2024-02-16 ENCOUNTER — Encounter: Payer: Self-pay | Admitting: Family Medicine

## 2024-02-16 VITALS — BP 110/72 | HR 90 | Temp 97.8°F | Ht 60.0 in | Wt 184.0 lb

## 2024-02-16 DIAGNOSIS — K5900 Constipation, unspecified: Secondary | ICD-10-CM

## 2024-02-16 DIAGNOSIS — D649 Anemia, unspecified: Secondary | ICD-10-CM | POA: Diagnosis not present

## 2024-02-16 DIAGNOSIS — E559 Vitamin D deficiency, unspecified: Secondary | ICD-10-CM

## 2024-02-16 DIAGNOSIS — N920 Excessive and frequent menstruation with regular cycle: Secondary | ICD-10-CM

## 2024-02-16 DIAGNOSIS — D259 Leiomyoma of uterus, unspecified: Secondary | ICD-10-CM | POA: Diagnosis not present

## 2024-02-16 DIAGNOSIS — R42 Dizziness and giddiness: Secondary | ICD-10-CM

## 2024-02-16 LAB — COMPREHENSIVE METABOLIC PANEL WITH GFR
ALT: 15 U/L (ref 0–35)
AST: 20 U/L (ref 0–37)
Albumin: 4.4 g/dL (ref 3.5–5.2)
Alkaline Phosphatase: 41 U/L (ref 39–117)
BUN: 9 mg/dL (ref 6–23)
CO2: 27 meq/L (ref 19–32)
Calcium: 9.4 mg/dL (ref 8.4–10.5)
Chloride: 102 meq/L (ref 96–112)
Creatinine, Ser: 1 mg/dL (ref 0.40–1.20)
GFR: 75.57 mL/min (ref >60.00)
Glucose, Bld: 77 mg/dL (ref 70–99)
Potassium: 4.1 meq/L (ref 3.5–5.1)
Sodium: 136 meq/L (ref 135–145)
Total Bilirubin: 0.6 mg/dL (ref 0.2–1.2)
Total Protein: 7.9 g/dL (ref 6.0–8.3)

## 2024-02-16 LAB — CBC WITH DIFFERENTIAL/PLATELET
Basophils Absolute: 0 K/uL (ref 0.0–0.1)
Basophils Relative: 0.8 % (ref 0.0–3.0)
Eosinophils Absolute: 0 K/uL (ref 0.0–0.7)
Eosinophils Relative: 1.2 % (ref 0.0–5.0)
HCT: 38 % (ref 36.0–46.0)
Hemoglobin: 12.2 g/dL (ref 12.0–15.0)
Lymphocytes Relative: 30.6 % (ref 12.0–46.0)
Lymphs Abs: 1 K/uL (ref 0.7–4.0)
MCHC: 32 g/dL (ref 30.0–36.0)
MCV: 77.7 fl — ABNORMAL LOW (ref 78.0–100.0)
Monocytes Absolute: 0.4 K/uL (ref 0.1–1.0)
Monocytes Relative: 11.6 % (ref 3.0–12.0)
Neutro Abs: 1.9 K/uL (ref 1.4–7.7)
Neutrophils Relative %: 55.8 % (ref 43.0–77.0)
Platelets: 230 K/uL (ref 150.0–400.0)
RBC: 4.89 Mil/uL (ref 3.87–5.11)
RDW: 14.2 % (ref 11.5–15.5)
WBC: 3.4 K/uL — ABNORMAL LOW (ref 4.0–10.5)

## 2024-02-16 LAB — TSH: TSH: 1.91 u[IU]/mL (ref 0.35–5.50)

## 2024-02-16 LAB — VITAMIN D 25 HYDROXY (VIT D DEFICIENCY, FRACTURES): VITD: 29.97 ng/mL — ABNORMAL LOW (ref 30.00–100.00)

## 2024-02-16 LAB — FERRITIN: Ferritin: 16.8 ng/mL (ref 10.0–291.0)

## 2024-02-16 NOTE — Patient Instructions (Signed)
 Please go downstairs for labs before you leave.  I will be in touch with your results  In the meantime you can start taking an over-the-counter vitamin D3 supplement as well as MiraLAX for constipation.  Be sure you are getting plenty of fiber in your diet.

## 2024-02-16 NOTE — Progress Notes (Signed)
 Subjective:     Patient ID: Ellen Roberts, female    DOB: 1996/04/08, 28 y.o.   MRN: [id number]  Chief Complaint  Patient presents with   Fibroids    States she was having imaging done 10/2 on foot and found uterine fibroid   Would like blood work done, checking iron levels    HPI  Discussed the use of AI scribe software for clinical note transcription with the patient, who gave verbal consent to proceed.  History of Present Illness Ellen Roberts is a 28 year old female who presents with concerns about a uterine fibroid and associated symptoms.  Abnormal uterine bleeding and dysmenorrhea - Uterine fibroid identified during imaging for chronic left leg pain and swelling - Menstrual cycles are regular, occurring monthly - Painful menses - Recent episode of heavy menstrual bleeding with a fully saturated sanitary pad within two hours on the second day of her cycle - Presence of blood clots during menstruation, correlating with increased pain - she has an appt with OB/GYN this month   Dizziness and suspected anemia - Frequent dizziness, particularly when bending down and standing up - History of mild anemia - Suspects dizziness may be related to anemia  Constipation - Regular constipation despite adequate water intake    Health Maintenance Due  Topic Date Due   COVID-19 Vaccine (1) Never done   Hepatitis C Screening  Never done   Pneumococcal Vaccine (1 of 2 - PCV) Never done   Hepatitis B Vaccines 19-59 Average Risk (1 of 3 - 19+ 3-dose series) Never done   HPV VACCINES (1 - Risk 3-dose SCDM series) Never done   Influenza Vaccine  12/01/2023    Past Medical History:  Diagnosis Date   Anemia    GERD (gastroesophageal reflux disease)    Sickle cell anemia (HCC) Trait    Past Surgical History:  Procedure Laterality Date   HERNIA REPAIR      Family History  Problem Relation Age of Onset   Diabetes Mother    Hypertension Mother    Colon cancer Neg Hx     Liver disease Neg Hx    Esophageal cancer Neg Hx     Social History   Socioeconomic History   Marital status: Married    Spouse name: Not on file   Number of children: 0   Years of education: Not on file   Highest education level: Master's degree (e.g., MA, MS, MEng, MEd, MSW, MBA)  Occupational History   Occupation: full time student  Tobacco Use   Smoking status: Never    Passive exposure: Never   Smokeless tobacco: Never  Vaping Use   Vaping status: Never Used  Substance and Sexual Activity   Alcohol use: Not Currently   Drug use: Never   Sexual activity: Yes    Birth control/protection: Condom  Other Topics Concern   Not on file  Social History Narrative   Not on file   Social Drivers of Health   Financial Resource Strain: Medium Risk (02/15/2024)   Overall Financial Resource Strain (CARDIA)    Difficulty of Paying Living Expenses: Somewhat hard  Food Insecurity: Food Insecurity Present (02/15/2024)   Hunger Vital Sign    Worried About Running Out of Food in the Last Year: Sometimes true    Ran Out of Food in the Last Year: Sometimes true  Transportation Needs: No Transportation Needs (02/15/2024)   PRAPARE - Administrator, Civil Service (Medical): No  Lack of Transportation (Non-Medical): No  Physical Activity: Unknown (02/15/2024)   Exercise Vital Sign    Days of Exercise per Week: Patient declined    Minutes of Exercise per Session: Not on file  Stress: No Stress Concern Present (02/15/2024)   [employer] of Occupational Health - Occupational Stress Questionnaire    Feeling of Stress: Not at all  Social Connections: Moderately Integrated (02/15/2024)   Social Connection and Isolation Panel    Frequency of Communication with Friends and Family: Twice a week    Frequency of Social Gatherings with Friends and Family: Once a week    Attends Religious Services: More than 4 times per year    Active Member of [employer] or Organizations: No     Attends Engineer, structural: Not on file    Marital Status: Married  Catering manager Violence: Not on file    Outpatient Medications Prior to Visit  Medication Sig Dispense Refill   clindamycin  (CLEOCIN  T) 1 % SWAB Apply 1 Application topically in the morning. 69 each 9   clobetasol ointment (TEMOVATE) 0.05 % Apply 1 Application topically as directed.     Multiple Vitamin (MULTIVITAMIN PO) Take 1 capsule by mouth daily.     spironolactone  (ALDACTONE ) 100 MG tablet Take 1 tablet (100 mg total) by mouth at bedtime. 30 tablet 9   tretinoin  (RETIN-A ) 0.025 % cream Apply topically at bedtime. Apply topically at bedtime. Apply 1 night weekly for the first month, then, Apply 2 nights weekly for 1 month, If your skin tolerates increase to 3 nights weekly starting at 3 months. Decrease usage if you experience excessive dryness to 1-2 nights or during colder months 45 g 3   methocarbamol  (ROBAXIN ) 500 MG tablet Take 1 tablet (500 mg total) by mouth every 8 (eight) hours as needed for muscle spasms. (Patient not taking: Reported on 02/16/2024) 30 tablet 0   indomethacin  (INDOCIN ) 50 MG capsule Take 1 capsule (50 mg total) by mouth 3 (three) times daily with meals. (Patient not taking: Reported on 02/16/2024) 30 capsule 0   No facility-administered medications prior to visit.    Allergies  Allergen Reactions   Peanut-Containing Drug Products     Ance    Aspirin Itching and Rash    Stomach pains    Review of Systems  Constitutional:  Negative for chills, fever and malaise/fatigue.  Respiratory:  Negative for shortness of breath.   Cardiovascular:  Negative for chest pain, palpitations and leg swelling.  Gastrointestinal:  Negative for abdominal pain, constipation, diarrhea, nausea and vomiting.  Genitourinary:  Negative for dysuria, frequency and urgency.  Neurological:  Positive for dizziness. Negative for focal weakness.       Dizziness with bending over        Objective:     Physical Exam Constitutional:      General: She is not in acute distress.    Appearance: She is not ill-appearing.  HENT:     Mouth/Throat:     Mouth: Mucous membranes are moist.     Pharynx: Oropharynx is clear.  Eyes:     Extraocular Movements: Extraocular movements intact.     Conjunctiva/sclera: Conjunctivae normal.     Pupils: Pupils are equal, round, and reactive to light.  Cardiovascular:     Rate and Rhythm: Normal rate and regular rhythm.  Pulmonary:     Effort: Pulmonary effort is normal.     Breath sounds: Normal breath sounds.  Abdominal:     General: Bowel sounds  are normal. There is no distension.     Palpations: Abdomen is soft. There is no mass.     Tenderness: There is no abdominal tenderness. There is no rebound.  Musculoskeletal:     Cervical back: Normal range of motion and neck supple.     Right lower leg: No edema.     Left lower leg: No edema.  Skin:    General: Skin is warm and dry.  Neurological:     General: No focal deficit present.     Mental Status: She is alert and oriented to person, place, and time.  Psychiatric:        Mood and Affect: Mood normal.        Behavior: Behavior normal.        Thought Content: Thought content normal.      BP 110/72   Pulse 90   Temp 97.8 F (36.6 C) (Temporal)   Ht 5' (1.524 m)   Wt 184 lb (83.5 kg)   SpO2 100%   BMI 35.94 kg/m  Wt Readings from Last 3 Encounters:  02/16/24 184 lb (83.5 kg)  01/18/24 194 lb (88 kg)  12/29/23 188 lb (85.3 kg)       Assessment & Plan:   Problem List Items Addressed This Visit   None Visit Diagnoses       Uterine leiomyoma, unspecified location    -  Primary     Menorrhagia with regular cycle         Constipation, unspecified constipation type       Relevant Orders   CBC with Differential/Platelet   Comprehensive metabolic panel with GFR   TSH     Dizziness       Relevant Orders   CBC with Differential/Platelet   Comprehensive metabolic panel with GFR    TSH     Mild anemia       Relevant Orders   CBC with Differential/Platelet   Comprehensive metabolic panel with GFR   Ferritin     Vitamin D deficiency       Relevant Orders   VITAMIN D 25 Hydroxy (Vit-D Deficiency, Fractures)       Assessment and Plan Assessment & Plan Uterine fibroid Incidental finding of a pedunculated uterine fibroid on imaging. Typically benign and asymptomatic, but can cause heavy menstrual bleeding. No size mentioned, suggesting it may not be large. - Refer to [facility] for further evaluation and management.  Heavy and painful menstrual bleeding Reports painful periods consistently and a recent episode of heavy menstrual bleeding with blood clots, particularly on the second day of menstruation. - OB GYN to evaluate and manage heavy and painful menstrual bleeding.  Constipation Chronic constipation reported. Advised to ensure adequate fiber intake and hydration. - Recommend increasing fiber intake. - Suggest using MiraLAX as needed for constipation.  Dizziness, possible anemia Experiencing frequent dizziness, particularly when bending down. Possible anemia considered as a cause. Mild anemia noted. - Order blood work to check for anemia      I have discontinued Kasee Natividad's indomethacin . I am also having her maintain her clobetasol ointment, Multiple Vitamin (MULTIVITAMIN PO), tretinoin , spironolactone , clindamycin , and methocarbamol .  No orders of the defined types were placed in this encounter.

## 2024-02-22 ENCOUNTER — Ambulatory Visit: Admitting: Family Medicine

## 2024-02-24 DIAGNOSIS — M9903 Segmental and somatic dysfunction of lumbar region: Secondary | ICD-10-CM | POA: Diagnosis not present

## 2024-02-24 DIAGNOSIS — M9901 Segmental and somatic dysfunction of cervical region: Secondary | ICD-10-CM | POA: Diagnosis not present

## 2024-02-24 DIAGNOSIS — M9905 Segmental and somatic dysfunction of pelvic region: Secondary | ICD-10-CM | POA: Diagnosis not present

## 2024-02-24 DIAGNOSIS — M9902 Segmental and somatic dysfunction of thoracic region: Secondary | ICD-10-CM | POA: Diagnosis not present

## 2024-02-25 ENCOUNTER — Ambulatory Visit: Payer: Self-pay

## 2024-02-29 ENCOUNTER — Ambulatory Visit (INDEPENDENT_AMBULATORY_CARE_PROVIDER_SITE_OTHER)

## 2024-02-29 ENCOUNTER — Ambulatory Visit: Admitting: Obstetrics and Gynecology

## 2024-02-29 ENCOUNTER — Encounter: Payer: Self-pay | Admitting: Obstetrics and Gynecology

## 2024-02-29 VITALS — BP 108/73 | HR 85 | Ht 63.0 in | Wt 182.0 lb

## 2024-02-29 DIAGNOSIS — M9905 Segmental and somatic dysfunction of pelvic region: Secondary | ICD-10-CM | POA: Diagnosis not present

## 2024-02-29 DIAGNOSIS — D219 Benign neoplasm of connective and other soft tissue, unspecified: Secondary | ICD-10-CM

## 2024-02-29 DIAGNOSIS — M9902 Segmental and somatic dysfunction of thoracic region: Secondary | ICD-10-CM | POA: Diagnosis not present

## 2024-02-29 DIAGNOSIS — M9903 Segmental and somatic dysfunction of lumbar region: Secondary | ICD-10-CM | POA: Diagnosis not present

## 2024-02-29 DIAGNOSIS — M9901 Segmental and somatic dysfunction of cervical region: Secondary | ICD-10-CM | POA: Diagnosis not present

## 2024-02-29 NOTE — Progress Notes (Signed)
   RETURN GYNECOLOGY VISIT  Subjective:  Ellen Roberts is a 28 y.o. G0P0 with LMP 02/21/24 presenting for fibroids  Diagnosed incidentally on vascular US . Had a heavier period last cycle, but cycles are usually regular. PCP notes cycles have been painful. No bulk symptoms.   Not currently sexually active. Anticipates TTC next year.   I personally reviewed the following: - Vascular US  02/01/24 incidental finding of pedunculated uterine fibroid - Pap NILM 03/10/22  - Note from [facility] NP-C 02/16/24 - CBC 02/16/24 Hgb 12.2 - TSH 02/16/24 1.91  Objective:   Vitals:   02/29/24 1113  BP: 108/73  Pulse: 85  Weight: 182 lb (82.6 kg)  Height: 5' 3 (1.6 m)   General:  Alert, oriented and cooperative. Patient is in no acute distress.  Skin: Skin is warm and dry. No rash noted.   Cardiovascular: Normal heart rate noted  Respiratory: Normal respiratory effort, no problems with respiration noted   Assessment and Plan:  Ellen Roberts is a 28 y.o. with uterine fibroid  1. Fibroid (Primary) - Discussed dx of fibroids and that women may be asymptomatic or have bleeding/bulk symptoms. If treatment is needed based on symptoms it is targeted to to fibroid location and symptoms of concern - Will obtain pelvic US  to determine size & location of fibroids - Follow up pending US  results  - Discussed PNV for at least 1 month before TTC - US  PELVIC COMPLETE WITH TRANSVAGINAL; Future  Return in about 1 year (around 02/28/2025) for annual exam with pap or sooner as needed.  Future Appointments  Date Time Provider Department Center  03/08/2024  7:45 AM Zenaida Reyes CHRISTELLA ALMETA [facility]  06/18/2024  3:15 PM Alm Delon SAILOR, DO [facility] None   Kieth JAYSON Carolin, MD

## 2024-03-02 ENCOUNTER — Ambulatory Visit: Payer: Self-pay | Admitting: Obstetrics and Gynecology

## 2024-03-07 NOTE — Therapy (Deleted)
 OUTPATIENT PHYSICAL THERAPY CERVICAL/LUMBAR EVALUATION   Patient Name: Ellen Roberts MRN: [id number] DOB:Mar 21, 1996, 28 y.o., female Today's Date: 03/07/2024  END OF SESSION:   Past Medical History:  Diagnosis Date   Anemia    GERD (gastroesophageal reflux disease)    Sickle cell anemia (HCC) Trait   Past Surgical History:  Procedure Laterality Date   HERNIA REPAIR     Patient Active Problem List   Diagnosis Date Noted   Muscle strain 01/18/2024   Bilateral posterior neck pain 01/18/2024   Bilateral hip pain 01/18/2024   Chronic bilateral low back pain without sciatica 01/18/2024   Left leg swelling 12/30/2023   Pilonidal abscess 09/20/2023   Dyspepsia 05/31/2023   Skin tags, multiple acquired 05/31/2023    PCP: Lendia Boby CROME, NP-C   REFERRING PROVIDER: Alvia Corean CROME, FNP  REFERRING DIAG: T14.8XXA (ICD-10-CM) - Muscle strain M54.2 (ICD-10-CM) - Bilateral posterior neck pain M25.551,M25.552 (ICD-10-CM) - Bilateral hip pain M54.50,G89.29 (ICD-10-CM) - Chronic bilateral low back pain without sciatica  THERAPY DIAG:  No diagnosis found.  Rationale for Evaluation and Treatment: Rehabilitation  ONSET DATE: chronic  SUBJECTIVE:                                                                                                                                                                                                         SUBJECTIVE STATEMENT: Ellen Roberts is a 28 year old female who presents with hip and back pain.   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 No current medication use, open to physical therapy. - Recommend specialized cushion for chair support. - Provide exercises for neck, shoulders, and low back twice daily for 1-2 weeks. - Refer to physical therapy  if no improvement after 2 weeks. - Prescribe ibuprofen  or Aleve as needed. - Advise heat therapy for pain relief. - Prescribe muscle relaxers for nighttime use, option to take half dose initially. - Send referral to [facility] physical therapy. - Follow up as needed after physical therapy.  PAIN:  Are you having pain? Yes: NPRS scale: *** Pain location: *** Pain description: *** Aggravating factors: *** Relieving factors: ***  PAIN:  Are you having pain? Yes: NPRS scale: *** Pain location: *** Pain description: *** Aggravating factors: *** Relieving factors: ***   PRECAUTIONS: None  RED FLAGS: None     WEIGHT BEARING RESTRICTIONS: No  FALLS:  Has patient fallen in last 6 months? No  OCCUPATION: ***  PLOF: Independent  PATIENT GOALS: To manage my symptoms  NEXT MD VISIT: TBD  OBJECTIVE:  Note: Objective measures were completed at Evaluation unless otherwise noted.  DIAGNOSTIC FINDINGS:  none  PATIENT SURVEYS:  Oswestry Low Back Pain Disability Questionnaire: @OPRCOSWESTRYLBPDISABILITYQQ @  NDI:  NECK DISABILITY INDEX  Date: *** Score  Pain intensity {NDI-1:32931}  2. Personal care (washing, dressing, etc.) {NDI-2:32932}  3. Lifting {NDI-3:32933}  4.  Reading {NDI-4:32934}  5. Headaches {NDI-5:32935}  6. Concentration {NDI-6:32936}  7. Work {NDI-7:32937}  8. Driving {WIP-1:67061}  9. Sleeping {NDI-9:32939}  10. Recreation {NDI-10:32940}  Total ***/50   Minimum Detectable Change (90% confidence): 5 points or 10% points   POSTURE: {posture:25561}  PALPATION: ***   CERVICAL ROM:   {AROM/PROM:27142} ROM A/PROM (deg) eval  Flexion   Extension   Right lateral flexion   Left lateral flexion   Right rotation   Left rotation    (Blank rows = not tested) LUMBAR ROM:   {AROM/PROM:27142}  A/PROM  eval  Flexion   Extension   Right lateral flexion   Left lateral flexion   Right rotation   Left rotation    (Blank rows = not tested)  UPPER EXTREMITY ROM:  {AROM/PROM:27142} ROM Right eval Left eval  Shoulder flexion    Shoulder extension    Shoulder abduction    Shoulder adduction    Shoulder extension    Shoulder internal rotation    Shoulder external rotation    Elbow flexion    Elbow extension    Wrist flexion    Wrist extension    Wrist ulnar deviation    Wrist radial deviation    Wrist pronation    Wrist supination     (Blank rows = not tested)  UPPER EXTREMITY MMT:  MMT Right eval Left eval  Shoulder flexion    Shoulder extension    Shoulder abduction    Shoulder adduction    Shoulder extension    Shoulder internal rotation    Shoulder external rotation    Middle trapezius    Lower trapezius    Elbow flexion    Elbow extension    Wrist flexion    Wrist extension    Wrist ulnar deviation    Wrist radial deviation    Wrist pronation    Wrist supination    Grip strength     (Blank rows = not tested)  CERVICAL SPECIAL TESTS:  Neck flexor muscle endurance test: {pos/neg:25243}, Upper limb tension test (ULTT): {pos/neg:25243}, and Spurling's test: {pos/neg:25243}  LUMBAR SPECIAL TESTS:  Straight leg raise test: {pos/neg:25243} and Slump test: {pos/neg:25243}   FUNCTIONAL TESTS:  30  seconds chair stand test  TREATMENT:  [facility] Adult PT Treatment:                                                DATE: *** Eval and HEP Self Care: Additional minutes spent for educating on updated Therapeutic Home Exercise Program as well as comparing current status to condition at start of symptoms. This included exercises focusing on stretching, strengthening, with focus on eccentric aspects. Long term goals include an improvement in range of motion, strength, endurance as well as avoiding reinjury. Patient's frequency would include in 1-2 times a day, 3-5 times a week for a duration of 6-12 weeks. Proper technique shown and discussed handout in great detail. All questions were discussed and addressed.      PATIENT EDUCATION:  Education details: Discussed eval findings, rehab rationale and POC and patient is in agreement  Person educated: Patient Education method: Explanation and Handouts Education comprehension: verbalized understanding and needs further education  HOME EXERCISE PROGRAM: ***  ASSESSMENT:  CLINICAL IMPRESSION: Patient is a *** y.o. *** who was seen today for physical therapy evaluation and treatment for ***.   OBJECTIVE IMPAIRMENTS: {opptimpairments:25111}.   ACTIVITY LIMITATIONS: {activitylimitations:27494}  PARTICIPATION LIMITATIONS: {participationrestrictions:25113}  PERSONAL FACTORS: {Personal factors:25162} are also affecting patient's functional outcome.   REHAB POTENTIAL: Good  CLINICAL DECISION MAKING: Stable/uncomplicated  EVALUATION COMPLEXITY: Moderate   GOALS: Goals reviewed with patient? No  SHORT TERM GOALS: Target date: ***  Patient to demonstrate independence in HEP  Baseline:  Goal status: INITIAL  2.  *** Baseline:  Goal status: INITIAL  3.  *** Baseline:  Goal status: INITIAL  4.  *** Baseline:  Goal  status: INITIAL  5.  *** Baseline:  Goal status: INITIAL  6.  *** Baseline:  Goal status: INITIAL  LONG TERM GOALS: Target date: ***  Patient will acknowledge ***/10 pain at least once during episode of care   Baseline:  Goal status: INITIAL  2.  Patient will score at least ***% on FOTO to signify clinically meaningful improvement in functional abilities.   Baseline:  Goal status: INITIAL  3.  Patient will increase 30s chair stand reps from *** to *** with/without arms to demonstrate and improved functional ability with less pain/difficulty as well as reduce fall risk.  Baseline:  Goal status: INITIAL  4.  *** Baseline:  Goal status: INITIAL  5.  *** Baseline:  Goal status: INITIAL  6.  *** Baseline:  Goal status: INITIAL   PLAN:  PT FREQUENCY: 1-2x/week  PT DURATION: 6 weeks  PLANNED INTERVENTIONS: 97110-Therapeutic exercises, 97530- Therapeutic activity, 97112- Neuromuscular re-education, 97535- Self Care, 02859- Manual therapy, and Patient/Family education  PLAN FOR NEXT SESSION: HEP review and update, manual techniques as appropriate, aerobic tasks, ROM and flexibility activities, strengthening and PREs, TPDN, gait and balance training,aquatic therapy, modalities for pain and NMRE      Reyes CHRISTELLA Kohut, PT 03/07/2024, 11:57 AM

## 2024-03-08 ENCOUNTER — Ambulatory Visit: Attending: Family Medicine

## 2024-03-08 DIAGNOSIS — M542 Cervicalgia: Secondary | ICD-10-CM | POA: Insufficient documentation

## 2024-03-08 DIAGNOSIS — M25551 Pain in right hip: Secondary | ICD-10-CM | POA: Insufficient documentation

## 2024-03-08 DIAGNOSIS — M5459 Other low back pain: Secondary | ICD-10-CM | POA: Insufficient documentation

## 2024-03-08 DIAGNOSIS — M25552 Pain in left hip: Secondary | ICD-10-CM | POA: Insufficient documentation

## 2024-03-08 DIAGNOSIS — M546 Pain in thoracic spine: Secondary | ICD-10-CM | POA: Insufficient documentation

## 2024-03-27 ENCOUNTER — Ambulatory Visit

## 2024-03-27 ENCOUNTER — Other Ambulatory Visit: Payer: Self-pay

## 2024-03-27 DIAGNOSIS — M542 Cervicalgia: Secondary | ICD-10-CM

## 2024-03-27 DIAGNOSIS — M25551 Pain in right hip: Secondary | ICD-10-CM | POA: Diagnosis not present

## 2024-03-27 DIAGNOSIS — M546 Pain in thoracic spine: Secondary | ICD-10-CM | POA: Diagnosis not present

## 2024-03-27 DIAGNOSIS — M5459 Other low back pain: Secondary | ICD-10-CM | POA: Diagnosis not present

## 2024-03-27 DIAGNOSIS — M25552 Pain in left hip: Secondary | ICD-10-CM | POA: Diagnosis not present

## 2024-03-27 NOTE — Therapy (Signed)
 OUTPATIENT PHYSICAL THERAPY EVALUATION   Patient Name: Ellen Roberts MRN: [id number] DOB:[birth year], 28 y.o., female Today's Date: 03/27/2024  END OF SESSION:  PT End of Session - 03/27/2024      Visit Number 1   Number of Visits 12   Date for Recertification 05/08/2024    Authorization Type [employer]    PT Start Time 1621    PT Stop Time 1652    PT Time Calculation (min) 31 min    Activity Tolerance Patient tolerated treatment well     Behavior During Therapy [facility] for tasks assessed/performed     Past Medical History:  Diagnosis Date   Anemia    GERD (gastroesophageal reflux disease)    Sickle cell anemia (HCC) Trait   Past Surgical History:  Procedure Laterality Date   HERNIA REPAIR     Patient Active Problem List   Diagnosis Date Noted   Muscle strain 01/18/2024   Bilateral posterior neck pain 01/18/2024   Bilateral hip pain 01/18/2024   Chronic bilateral low back pain without sciatica 01/18/2024   Left leg swelling 12/30/2023   Pilonidal abscess 09/20/2023   Dyspepsia 05/31/2023   Skin tags, multiple acquired 05/31/2023    PCP: Lendia Boby CROME, NP-C  REFERRING PROVIDER:  Alvia Corean CROME, FNP    REFERRING DIAG:  T14.8XXA (ICD-10-CM) - Muscle strain  M54.2 (ICD-10-CM) - Bilateral posterior neck pain  M25.551,M25.552 (ICD-10-CM) - Bilateral hip pain  M54.50,G89.29 (ICD-10-CM) - Chronic bilateral low back pain without sciatica    Rationale for Evaluation and Treatment: Rehabilitation  THERAPY DIAG:  Cervicalgia  Pain in thoracic spine  Other low back pain  Pain of both hip joints  ONSET DATE: 0918/2025 date of referral   SUBJECTIVE:                                                                                                                                                                                           SUBJECTIVE STATEMENT: Patient reports to PT with primary concern of LBP and BIL Hip pain. She feels that her pain comes from  sitting all day at work. She states that she has a comfortable and adjustable WFH set up, including standing desk option. She previously had improvement from chiropractor which she is still seeing.   Patient is right hand dominant   PERTINENT HISTORY:  Relevant PMHx includes Sickle Cell Anemia  PAIN:  Are you having pain? Yes: NPRS scale: 7/10 in all location  Pain location: lateral hip, upper back, lower neck, R shoulder  Pain description: aching  Aggravating factors: sitting for a long time  Relieving factors: n/a  PRECAUTIONS: None  RED FLAGS: None   WEIGHT BEARING RESTRICTIONS: No  FALLS:  Has patient fallen in last 6 months? 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  DIAGNOSTIC FINDINGS:  No relevant imaging results available in epic; none included in referral    PATIENT SURVEYS:  MODIFIED OSWESTRY DISABILITY SCALE  Date: 03/27/2024  Score  Pain intensity 4 =  Pain medication provides me with little relief from pain.  2. Personal care (washing, dressing, etc.) 0 =  I can take care of myself normally without causing increased pain.  3. Lifting 0 = I can lift heavy weights without increased pain.  4. Walking 0 = Pain does not prevent me from walking any distance  5. Sitting 3 =  Pain prevents me from sitting more than  hour.  6. Standing 2 =  Pain prevents me from standing more than 1 hour  7. Sleeping 0 = Pain does not prevent me from sleeping well.  8. Social Life 0 = My social life is normal and  does not increase my pain.  9. Traveling 2 =  My pain restricts my travel over 2 hours.  10. Employment/ Homemaking 1 = My normal homemaking/job activities increase my pain, but I can still perform all that is required of me  Total 12/50   Interpretation of scores: Score Category Description  0-20% Minimal Disability The patient can cope with most living activities. Usually no treatment is indicated apart from advice on lifting, sitting and exercise  21-40% Moderate Disability The patient experiences more pain and difficulty with sitting, lifting and standing. Travel and social life are more difficult and they may be disabled from work. Personal care, sexual activity and sleeping are not grossly affected, and the patient can usually be managed by conservative means  41-60% Severe Disability Pain remains the main problem in this group, but activities of daily living are affected. These patients require a detailed investigation  61-80% Crippled Back pain impinges on all aspects of the patient's life. Positive intervention is required  81-100% Bed-bound These patients are either bed-bound or exaggerating their symptoms  Bluford FORBES Zoe DELENA Karon DELENA, et al. Surgery versus conservative management of stable thoracolumbar fracture: the PRESTO feasibility RCT. [place] ([place]): [employer]; 2021 Nov. [facility] Technology Assessment, No. 25.62.) Appendix 3, Oswestry Disability Index category descriptors. Available from: Findjewelers.cz  Minimally Clinically Important Difference (MCID) = 12.8%  NDI:  NECK DISABILITY INDEX  Date: 03/27/2024  Score  Pain intensity 5 =The pain is the worst imaginable at the moment  2. Personal care (washing, dressing, etc.) 0 = I can look after myself normally without causing extra pain  3. Lifting 0 =  I can lift heavy weights without extra pain  4. Reading 0 = I can read as much as I want to with no pain in my neck  5. Headaches 0  = I have no headaches at all  6. Concentration 3 = I have a lot of difficulty in concentrating when I want to  7. Work 2 = I can do most of my usual work,  but no more  8. Driving 1 =  I can drive my car as long as I want with slight pain in my neck  9. Sleeping 0 = I have no trouble sleeping  10. Recreation 2 = I am able to engage in most, but not all of my usual recreation activities because of   pain in my neck  Total 12/50   Minimum Detectable Change (90% confidence): 5 points or 10% points  COGNITION: Overall cognitive status: Within functional limits for tasks assessed     SENSATION: Not tested  MUSCLE LENGTH: deferred  POSTURE: posterior pelvic tilt  PALPATION: Moderate TTP and mild hypomobility with CPA along lower cervical, upper thoracic, and upper lumbar spine. Mild TTP with palpation of lumbar paraspinals   CERVICAL ROM:   Active ROM A/PROM (deg) eval  Flexion WFL  Extension WFL, p!  Right lateral flexion WFL, strain   Left lateral flexion WFL strain   Right rotation WFL, strain  Left rotation WFL, strain   (Blank rows = not tested)  Thoracic flexion AROM WNL Thoracic extension AROM WNL, mid thoracic pain   LUMBAR ROM:   AROM eval  Flexion WFL, p!   Extension WFL   Right lateral flexion WFL  Left lateral flexion WFL  Right rotation WFL  Left rotation WFL, p!   (Blank rows = not tested)  UPPER EXTREMITY MMT:  MMT Right eval Left eval  Shoulder flexion    Shoulder extension    Shoulder abduction    Shoulder adduction    Shoulder extension    Shoulder internal rotation    Shoulder external rotation    Middle trapezius 4+ 4+  Lower trapezius 4+ 4+  Elbow flexion    Elbow extension    Wrist flexion    Wrist extension    Wrist ulnar deviation    Wrist radial deviation    Wrist pronation    Wrist supination    Grip strength     (Blank rows = not tested)   LOWER EXTREMITY MMT:    MMT Right eval Left eval  Hip flexion 4- 4-   Hip extension    Hip abduction 3+ 3+  Hip adduction    Hip internal rotation 3+ 4-  Hip external rotation 4- 4-  Knee flexion 5 5  Knee extension 5 5  Ankle dorsiflexion    Ankle plantarflexion    Ankle inversion    Ankle eversion     (Blank rows = not tested)  LUMBAR SPECIAL TESTS:  deferred  FUNCTIONAL TESTS:  deferred   TREATMENT DATE:   [facility] Adult PT Treatment:                                                DATE: 03/27/2024   Initial evaluation: see patient education and home exercise program as noted below  PATIENT EDUCATION:  Education details: reviewed initial home exercise program; discussion of POC, prognosis and goals for skilled PT   Person educated: Patient Education method: Explanation, Demonstration, and Handouts Education comprehension: verbalized understanding, returned demonstration, and needs further education  HOME EXERCISE PROGRAM: Access Code: [email or web address] URL: [email or web address] Date: 03/27/2024 Prepared by: Marko Molt  Exercises - Supine Lower Trunk Rotation  - 2 x daily - 5 x weekly - 2 sets - 10 reps - 3 sec hold - Supine ITB Stretch  - 2 x daily - 5 x weekly - 4 sets - 10 sec hold - Seated Flexion Stretch  - 2 x daily - 5 x weekly - 2 sets - 10 reps - 3 sec hold - Standing Cervical Retraction  - 2 x daily - 5 x weekly - 2 sets - 10 reps - 3 sec hold - Seated Scapular Retraction  - 2 x daily - 5 x weekly - 2 sets - 10 reps - 3 sec hold  Patient Education - Office Posture  ASSESSMENT:  CLINICAL IMPRESSION: Ellen Roberts is a 28 y.o. female who was seen today for physical therapy evaluation and treatment for persistent neck pain, lumbar pain and BIL hip pain with strength deficits. She is demonstrating normal AROM of cervical, thoracic and lumbar spine. However, she has mild hypomobility with CPA  along upper thoracic, and upper lumbar spine. She has related pain and difficulty with prolonged sitting as necessary for occupational tasks, traveling, prolonged standing and demonstrating difficulty with prolonged CS flexion for reading.. She requires skilled PT services at this time to address relevant deficits and improve overall function.     OBJECTIVE IMPAIRMENTS: decreased activity tolerance, decreased endurance, decreased strength, improper body mechanics, and pain.   ACTIVITY LIMITATIONS: sitting, standing, and reach over head  PARTICIPATION LIMITATIONS: meal prep, cleaning, driving, shopping, and occupation  PERSONAL FACTORS: Profession, Time since onset of injury/illness/exacerbation, and 1 comorbidity: Sickle Cell Anemia are also affecting patient's functional outcome.   REHAB POTENTIAL: Fair    CLINICAL DECISION MAKING: Stable/uncomplicated  EVALUATION COMPLEXITY: Low   GOALS: Goals reviewed with patient? YES  SHORT TERM GOALS: Target date: 04/17/2024   Patient will be independent with initial home program at least 3 days/week.  Baseline: provided at eval Goal Status: INITIAL   2. Patient will demonstrate at least 4+/5 BIL Hip MMT  Baseline: see objective measures  Goal status: INITIAL   LONG TERM GOALS: Target date: 05/08/2024    Patient will report improved overall functional ability with NDI score of 5 or less.  Baseline: 12/50 Goal Status: INITIAL    2.  Patient will report improved overall functional ability with ODI score of 5 or less.  Baseline: 12/50 Goal status: INITIAL  3.  Patient will report ability to tolerate sitting/driving for at least 1 hour, with minimal symptoms.  Baseline: limited by moderate-to-severe pain Goal status: INITIAL  4.  Patient will report no more than 3/10 pain at end of her workday, using ergonomic modifications and HEP as needed.  Baseline: 7/10 Goal status: INITIAL    PLAN:  PT FREQUENCY: 1-2x/week  PT  DURATION: 6 weeks  PLANNED INTERVENTIONS: 97164- PT Re-evaluation, 97750- Physical Performance Testing, 97110-Therapeutic exercises, 97530- Therapeutic activity, [id number]- Neuromuscular re-education, 97535- Self Care, 02859- Manual therapy, [id number]- Electrical stimulation (unattended), [phone]- Electrical stimulation (manual), [id number]- Traction (mechanical), 20560 (1-2 muscles), 20561 (3+ muscles)- Dry Needling, Patient/Family education, Taping, Joint mobilization, Joint manipulation, Spinal manipulation, Spinal mobilization, Cryotherapy, and Moist heat.  PLAN FOR NEXT SESSION:  address periscapular mm endurance, BIL hip strengthening, consider aerobic activity, manual therapy and modalities as needed for pain modulation.    Marko Molt, PT, DPT  03/30/2024 5:35 PM

## 2024-04-10 DIAGNOSIS — L658 Other specified nonscarring hair loss: Secondary | ICD-10-CM | POA: Diagnosis not present

## 2024-04-11 ENCOUNTER — Telehealth (INDEPENDENT_AMBULATORY_CARE_PROVIDER_SITE_OTHER): Admitting: Obstetrics and Gynecology

## 2024-04-11 DIAGNOSIS — D259 Leiomyoma of uterus, unspecified: Secondary | ICD-10-CM

## 2024-04-11 DIAGNOSIS — N840 Polyp of corpus uteri: Secondary | ICD-10-CM

## 2024-04-11 DIAGNOSIS — E282 Polycystic ovarian syndrome: Secondary | ICD-10-CM

## 2024-04-11 DIAGNOSIS — D219 Benign neoplasm of connective and other soft tissue, unspecified: Secondary | ICD-10-CM

## 2024-04-11 NOTE — Progress Notes (Signed)
° °  TELEHEALTH GYNECOLOGY VISIT ENCOUNTER NOTE  Provider location: [facility] for [facility] [place]   Patient location: Home  I connected with Ellen Roberts on 04/11/2024 at 3:10PM EST by MyChart audiovisual encounter    History:  Ellen Roberts is a 28 y.o. G0P0 presenting for discussion of ultrasound results  Incidentally noted fibroid on vascular US , so we obtained a pelvic US  to better understand her fibroids. She has regular cycles without bulk symptoms and recently had a heavier period with her cycle in October but denies any major issues with her period. Today, she also reports hair growth under her chin that may be genetic, but she has started spironolactone  for treatment.   Pelvic US  reviewed with several nonspecific findings including: - 2.2cm anterior fundal IM fibroid - 1.6cm fundal endometrial polyp, EL 6mm - PCO morphology of the ovaries - possible engorgement of the left paraovarian vascular structures - 1.3cm indeterminate structure in the right adnexa that could reflect a bowel loop    Past Medical History:  Diagnosis Date   Anemia    GERD (gastroesophageal reflux disease)    Sickle cell anemia (HCC) Trait   Past Surgical History:  Procedure Laterality Date   HERNIA REPAIR     The following portions of the patient's history were reviewed and updated as appropriate: allergies, current medications, past family history, past medical history, past social history, past surgical history and problem list.   Health Maintenance:  Normal pap 03/10/22  Review of Systems:  Pertinent items noted in HPI and remainder of comprehensive ROS otherwise negative.  Physical Exam:   General:  Alert, oriented and cooperative.   Mental Status: Normal mood and affect perceived. Normal judgment and thought content.  Physical exam deferred due to nature of the encounter  Labs and Imaging No results found for this or any previous visit (from the past 2 weeks). No results  found.    Assessment and Plan:   Fibroid Endometrial polyp PCO morphology of ovaries - Ultrasound findings reviewed in detail with the patient - She is essentially asymptomatic - For fibroid, recommend expectant management.  - For polyp, recommend sonohysterogram to confirm diagnosis before planning surgical removal. Given that she is asymptomatic, not TTC, and premenopausal this does not need to be done in any urgent fashion - For PCO morphology of ovaries, she is regularly menstruating which points away from PCOS. She does note hair growth but attributes this more to familial pattern of hair growth - Given that she is asymptomatic, she would like to expectantly manage - Follow up in 1 year for annual w/ pap or sooner if concerns arise  I discussed the assessment and treatment plan with the patient. The patient was provided an opportunity to ask questions and all were answered. The patient agreed with the plan and demonstrated an understanding of the instructions.  I provided 14 minutes of non-face-to-face time during this encounter.  Kieth JAYSON Carolin, MD [facility] for [employer], [facility]

## 2024-04-16 ENCOUNTER — Ambulatory Visit: Attending: Family Medicine

## 2024-05-01 ENCOUNTER — Telehealth: Payer: Self-pay | Admitting: Family Medicine

## 2024-05-01 ENCOUNTER — Other Ambulatory Visit: Payer: Self-pay | Admitting: Family Medicine

## 2024-05-01 NOTE — Telephone Encounter (Signed)
"  Unable to pend  "

## 2024-05-01 NOTE — Telephone Encounter (Unsigned)
 Copied from CRM [id number]. Topic: Clinical - Medication Refill >> May 01, 2024 11:05 AM Zy'onna H wrote: Medication: Iansoprazole(PREVACID ) 30 MG Capsule (Hasn't been prescribed in a while)  Latest Dosage ended: 05/31/2023  Has the patient contacted their pharmacy? Yes (Agent: If no, request that the patient contact the pharmacy for the refill. If patient does not wish to contact the pharmacy document the reason why and proceed with request.) (Agent: If yes, when and what did the pharmacy advise?)  This is the patient's preferred pharmacy:  [id number] [place], [place] - [address] [place] [address] Phone: [phone] Fax: [phone]  Is this the correct pharmacy for this prescription? Yes If no, delete pharmacy and type the correct one.   Has the prescription been filled recently? No  Is the patient out of the medication? Yes  Has the patient been seen for an appointment in the last year OR does the patient have an upcoming appointment? Yes  Can we respond through MyChart? Yes  Agent: Please be advised that Rx refills may take up to 3 business days. We ask that you follow-up with your pharmacy.

## 2024-05-08 NOTE — Telephone Encounter (Signed)
 This was discontinued on 05/31/23 as it was ineffective. Not been used since 2024 so we are unable to fill at this time

## 2024-05-08 NOTE — Telephone Encounter (Signed)
 ATC pt to inform but mailbox is full. Can send in Protonix  instead but only for 30 days. Will need to follow up w GI as requested

## 2024-06-18 ENCOUNTER — Ambulatory Visit: Admitting: Dermatology
